# Patient Record
Sex: Female | Born: 1995 | Race: Black or African American | Hispanic: No | Marital: Single | Smoking: Former smoker
Health system: Southern US, Community
[De-identification: ages and names within clinical notes are randomized; demographics above are authoritative.]

## PROBLEM LIST (undated history)

## (undated) DIAGNOSIS — J302 Other seasonal allergic rhinitis: Secondary | ICD-10-CM

---
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
## 2015-02-25 ENCOUNTER — Ambulatory Visit: Payer: Medicaid Other | Admitting: Family Medicine

## 2015-03-09 ENCOUNTER — Ambulatory Visit (INDEPENDENT_AMBULATORY_CARE_PROVIDER_SITE_OTHER): Payer: Medicaid Other | Admitting: Family Medicine

## 2015-03-09 ENCOUNTER — Encounter: Payer: Self-pay | Admitting: Family Medicine

## 2015-03-09 VITALS — BP 100/61 | HR 80 | Temp 98.5°F | Wt 106.0 lb

## 2015-03-09 DIAGNOSIS — J069 Acute upper respiratory infection, unspecified: Secondary | ICD-10-CM | POA: Diagnosis present

## 2015-03-09 MED ORDER — PSEUDOEPHEDRINE HCL 60 MG PO TABS: 60.0000 mg | ORAL_TABLET | Freq: Three times a day (TID) | ORAL | Status: DC | PRN

## 2015-03-09 NOTE — Patient Instructions (Addendum)
Upper Respiratory Infection Treatment - you should: Take Advil/Tylenol for headaches and fever - I prescribed Sudafed which you should take only as needed for symptomatic relief before work.  You should be better in: 5-7 days  Call us if you have severe shortness of breath, high fever or are not better in 2 weeks

## 2015-03-09 NOTE — Progress Notes (Signed)
UPPER RESPIRATORY INFECTION  Onset: 2weeks ago Course: progressive; started out as cough, then developed HA and rhinorrhea Better with: NyQuil; flonase; hot tea Meds tried: OTC nyquil/flonase Sick contacts: no  Nasal discharge (color,laterality): thick and yellow  Sinusitis Risk Factors Fever: no   Headache/face pain: yes, left sided  Double sickening: no  Tooth pain: no   Allergy Risk Factors: Sneezing: no  Itchy scratchy throat: yes  Seasonal sx: yes; Zyrtec  Flu Risk Factors Headache: yes  Muscle aches: no  Severe fatigue: no    Red Flags  Stiff neck: no  Dyspnea: no  Rash: no  Swallowing difficulty: no    Objective: BP 100/61 mmHg  Pulse 80  Temp(Src) 98.5 F (36.9 C) (Oral)  Wt 106 lb (48.081 kg)  LMP 02/10/2015 Gen: NAD, alert HEENT: NCAT, EOMI, PERRL, OP clear, no exudate, mild nasal mucosa edema. CV: RRR, no murmur Resp: CTAB, no wheezes, non-labored   Assessment and plan:  Acute upper respiratory infection Patient has been experiencing upper respiratory symptoms for the past 2 weeks. Today in clinic symptoms appear fairly mild. Differential includes viral URI versus asthma reaction versus seasonal allergies. Current symptoms do not suggest any bacterial etiology. I discussed this thought process with the patient and she stated her understanding that no antibiotics were warranted at this time. Patient asked for help with symptomatic relief. Not currently interested in nasal spray. Patient would like something to take to alleviate symptoms while at work. - Sudafed 60 mg tablet 3 times a day when necessary. - Tylenol/ibuprofen for fever/discomfort - If symptoms persist over the course of 7-10 days see us back in the clinic for reevaluation.    Meds ordered this encounter  Medications  . pseudoephedrine (SUDAFED) 60 MG tablet    Sig: Take 1 tablet (60 mg total) by mouth every 8 (eight) hours as needed for congestion.    Dispense:  20 tablet    Refill:  0      Kathee DeltonIan D McKeag, MD,MS,  PGY2 03/10/2015 6:24 PM

## 2015-03-10 DIAGNOSIS — J069 Acute upper respiratory infection, unspecified: Secondary | ICD-10-CM | POA: Insufficient documentation

## 2015-03-10 NOTE — Assessment & Plan Note (Addendum)
Patient has been experiencing upper respiratory symptoms for the past 2 weeks. Today in clinic symptoms appear fairly mild. Differential includes viral URI versus asthma reaction versus seasonal allergies. Current symptoms do not suggest any bacterial etiology. I discussed this thought process with the patient and she stated her understanding that no antibiotics were warranted at this time. Patient asked for help with symptomatic relief. Not currently interested in nasal spray. Patient would like something to take to alleviate symptoms while at work. - Sudafed 60 mg tablet 3 times a day when necessary. - Tylenol/ibuprofen for fever/discomfort - If symptoms persist over the course of 7-10 days see us back in the clinic for reevaluation.

## 2015-04-11 ENCOUNTER — Other Ambulatory Visit: Payer: Self-pay | Admitting: Family Medicine

## 2015-04-28 ENCOUNTER — Ambulatory Visit: Payer: Medicaid Other

## 2015-05-09 ENCOUNTER — Emergency Department (HOSPITAL_COMMUNITY)
Admission: EM | Admit: 2015-05-09 | Discharge: 2015-05-09 | Discharge status: Absent without leave | Payer: Medicaid Other

## 2015-05-09 ENCOUNTER — Other Ambulatory Visit: Payer: Self-pay | Admitting: Family Medicine

## 2015-05-09 NOTE — ED Notes (Signed)
Called for triage, no answer

## 2015-05-10 ENCOUNTER — Ambulatory Visit (INDEPENDENT_AMBULATORY_CARE_PROVIDER_SITE_OTHER): Payer: Medicaid Other | Admitting: Family Medicine

## 2015-05-10 ENCOUNTER — Encounter: Payer: Self-pay | Admitting: Family Medicine

## 2015-05-10 VITALS — BP 98/61 | HR 73 | Temp 98.2°F | Ht 65.0 in | Wt 103.6 lb

## 2015-05-10 DIAGNOSIS — M79604 Pain in right leg: Secondary | ICD-10-CM | POA: Diagnosis present

## 2015-05-10 DIAGNOSIS — M79605 Pain in left leg: Secondary | ICD-10-CM | POA: Diagnosis not present

## 2015-05-10 DIAGNOSIS — Z23 Encounter for immunization: Secondary | ICD-10-CM | POA: Diagnosis present

## 2015-05-10 NOTE — Assessment & Plan Note (Signed)
Patient presents with complaints of bilateral leg pain which is now completely resolved. No history of injury or trauma.  No significant findings on physical exam. Etiology currently unknown for this bilateral leg pain. Patient states the pain was worse in bilateral knees. This causes me to believe that there could be some underlying patellar femoral pain syndrome at play. But further workup is difficult without patient experiencing symptoms at this time. -  Encouraged Aleve if symptoms recurred. -  Due to the possibility of patellofemoral pain syndrome I advised patient that initiation of regular quadriceps strengthening exercises may be beneficial in the future. Patient did not appear motivated at this time to begin these types of exercises but it may be something to stress later on if symptoms recur.

## 2015-05-10 NOTE — Patient Instructions (Addendum)
It was a pleasure seeing you today in our clinic. Today we discussed your leg pain. Here is the treatment plan we have discussed and agreed upon together:   -  At this time I do not live that there is any structural damage or injury occurring with your leg pain. -  If you experience this type of pain again in the future I would recommend taking Aleve twice a day as needed for this pain. If the pain is severe you may take 2 tablets each dosing.  Make sure to drink plenty of water. -  As we discussed in the clinic I believe it would be beneficial for you to begin regular quadriceps strengthening exercises.  This may help with the knee pain as well as any long-term effects of instability may be experiencing. -  If you have any persistent symptoms do not hesitate to make a follow-up appointment with your primary doctor.

## 2015-05-10 NOTE — Progress Notes (Signed)
Yesterday had body aches and lightheadedness. Legs aches were new, took ibuprofen, helped, came back later States that this AM all sxs have resolved.  Involved most of legs, and knees.    HPI  CC:  Bilateral leg pain.  Patient states that yesterday she experience some body aches. The achiness was most severe in her legs. She states that she has never experienced something like this in the past. She took ibuprofen which helped significantly, resolving the pain.  Unfortunately, after a period of time the pain came back. And again, responded well to ibuprofen. This morning she states that the pain has resolved completely. She states that she is currently asymptomatic. She denies any fevers, chills, weakness, numbness , paresthesias , urinary/fecal incontinence. No back pain. No trauma or injury.  Review of Systems   See HPI for ROS. All other systems reviewed and are negative.  Past medical history and social history reviewed and updated in the EMR as appropriate.  Objective: BP 98/61 mmHg  Pulse 73  Temp(Src) 98.2 F (36.8 C) (Oral)  Ht 5\' 5"  (1.651 m)  Wt 103 lb 9.6 oz (46.993 kg)  BMI 17.24 kg/m2  LMP 05/02/2015 Gen: NAD, alert, cooperative, and pleasant. Ext:  Strength 5/5 throughout, ROM full, Sensation intact throughout,  DTRs 2+ bilaterally.  All 4 ligaments of knees bilaterally intact with solid end points. Negative patellar grind. No evidence of effusion or swelling. No erythema or ecchymoses. Pulses intact bilaterally. Neuro: Alert and oriented, Speech clear, No gross deficits  Assessment and plan:  Bilateral leg pain  Patient presents with complaints of bilateral leg pain which is now completely resolved. No history of injury or trauma.  No significant findings on physical exam. Etiology currently unknown for this bilateral leg pain. Patient states the pain was worse in bilateral knees. This causes me to believe that there could be some underlying patellar femoral pain syndrome  at play. But further workup is difficult without patient experiencing symptoms at this time. -  Encouraged Aleve if symptoms recurred. -  Due to the possibility of patellofemoral pain syndrome I advised patient that initiation of regular quadriceps strengthening exercises may be beneficial in the future. Patient did not appear motivated at this time to begin these types of exercises but it may be something to stress later on if symptoms recur.    Orders Placed This Encounter  Procedures  . Flu Vaccine QUAD 36+ mos IM    Kathee DeltonIan D McKeag, MD,MS,  PGY2 05/10/2015 5:53 PM

## 2015-08-24 ENCOUNTER — Ambulatory Visit (INDEPENDENT_AMBULATORY_CARE_PROVIDER_SITE_OTHER): Payer: Medicaid Other | Admitting: Family Medicine

## 2015-08-24 VITALS — BP 95/57 | HR 79 | Temp 98.4°F | Wt 104.8 lb

## 2015-08-24 DIAGNOSIS — J069 Acute upper respiratory infection, unspecified: Secondary | ICD-10-CM | POA: Diagnosis not present

## 2015-08-24 DIAGNOSIS — Z3041 Encounter for surveillance of contraceptive pills: Secondary | ICD-10-CM | POA: Diagnosis not present

## 2015-08-24 DIAGNOSIS — Z3009 Encounter for other general counseling and advice on contraception: Secondary | ICD-10-CM | POA: Diagnosis not present

## 2015-08-24 LAB — POCT URINE PREGNANCY: Preg Test, Ur: NEGATIVE

## 2015-08-24 MED ORDER — NORGESTIM-ETH ESTRAD TRIPHASIC 0.18/0.215/0.25 MG-25 MCG PO TABS: 1.0000 | ORAL_TABLET | Freq: Every day | ORAL | Status: DC

## 2015-08-24 MED ORDER — BENZONATATE 100 MG PO CAPS: 100.0000 mg | ORAL_CAPSULE | Freq: Three times a day (TID) | ORAL | Status: DC | PRN

## 2015-08-24 NOTE — Assessment & Plan Note (Signed)
Patient presents with signs and symptoms consistent with upper respiratory infection. Etiology currently unknown however due to the timeline would highly suspect viral cause. Differential includes allergic rhinitis versus flu. Some flulike symptoms present however symptoms are relatively mild overall and patient has been afebrile throughout. - Encouraged continuation of antihistamine medications. - Tessalon Perles 3 times a day when necessary as needed for cough - Encouraged ibuprofen/acetaminophen use for fevers, chills, body aches. - Encouraged appropriate hydration

## 2015-08-24 NOTE — Assessment & Plan Note (Signed)
Patient is asking for refill on her oral concept is. She states she has been out for quite some time so she has not been taking these. However, she does state that she tolerated this medication well and would like to restart them as soon as possible. Denies recent sexual activity. LMP was about 48 hours ago. Urine pregnancy screen negative. - I restarted patient on previous oral contraceptive medication.

## 2015-08-24 NOTE — Patient Instructions (Signed)
Cough Treatment - you should: - Take the Tessalon Perles up to 3 times a day as needed for the cough. - Take over-the-counter ibuprofen or Tylenol for any body aches/chills/fever. - Make sure to stay well-hydrated. - Continue taking your home Zyrtec. You should be better in: 5-7 Call us if you have severe shortness of breath, high fever or are not better in 2 weeks

## 2015-08-24 NOTE — Progress Notes (Signed)
COUGH  Has been coughing for 1 days. Cough is: dry Sputum production: no Medications tried: zyrtec Taking blood pressure medications: no  Symptoms Runny nose: yes Mucous in back of throat: yes Throat burning or reflux: no Wheezing or asthma: no Fever: no Body Aches: YES Chest Pain: no Shortness of breath: no Leg swelling: no Hemoptysis: no Weight loss: no  Had flu shot.  Contraceptives: Patient would also like a refill on her oral contraceptive medication. She states she has not been on this medication for "a long time". She informed me that she has not had any recent sexual activity and her last menstrual period was about 2 days ago. Patient has willing and understanding of the need to obtain a urine pregnancy test today.  ROS see HPI Smoking Status noted  CC, SH/smoking status, and VS noted  Objective: BP 95/57 mmHg  Pulse 79  Temp(Src) 98.4 F (36.9 C) (Oral)  Wt 104 lb 12.8 oz (47.537 kg) Gen: NAD, alert, cooperative, and pleasant. HEENT: NCAT, EOMI, PERRL, clear rhinorrhea present, no LAD, OP erythematous without exudate CV: RRR, no murmur Resp: CTAB, no wheezes, non-labored Neuro: Alert and oriented, Speech clear, No gross deficits  Assessment and plan:  Acute upper respiratory infection Patient presents with signs and symptoms consistent with upper respiratory infection. Etiology currently unknown however due to the timeline would highly suspect viral cause. Differential includes allergic rhinitis versus flu. Some flulike symptoms present however symptoms are relatively mild overall and patient has been afebrile throughout. - Encouraged continuation of antihistamine medications. - Tessalon Perles 3 times a day when necessary as needed for cough - Encouraged ibuprofen/acetaminophen use for fevers, chills, body aches. - Encouraged appropriate hydration  Surveillance for birth control, oral contraceptives Patient is asking for refill on her oral concept is.  She states she has been out for quite some time so she has not been taking these. However, she does state that she tolerated this medication well and would like to restart them as soon as possible. Denies recent sexual activity. LMP was about 48 hours ago. Urine pregnancy screen negative. - I restarted patient on previous oral contraceptive medication.    Orders Placed This Encounter  Procedures  . POCT urine pregnancy    Meds ordered this encounter  Medications  . Norgestimate-Ethinyl Estradiol Triphasic (ORTHO TRI-CYCLEN LO) 0.18/0.215/0.25 MG-25 MCG tab    Sig: Take 1 tablet by mouth daily.    Dispense:  3 Package    Refill:  3  . benzonatate (TESSALON) 100 MG capsule    Sig: Take 1 capsule (100 mg total) by mouth 3 (three) times daily as needed for cough.    Dispense:  30 capsule    Refill:  0     Kathee DeltonIan D Kaleb Sek, MD,MS,  PGY2 08/24/2015 5:55 PM

## 2015-08-25 ENCOUNTER — Encounter: Payer: Medicaid Other | Admitting: Family Medicine

## 2015-10-19 ENCOUNTER — Ambulatory Visit (INDEPENDENT_AMBULATORY_CARE_PROVIDER_SITE_OTHER): Payer: Medicaid Other | Admitting: Family Medicine

## 2015-10-19 ENCOUNTER — Encounter: Payer: Self-pay | Admitting: Family Medicine

## 2015-10-19 VITALS — BP 92/54 | HR 68 | Temp 97.8°F | Ht 65.0 in | Wt 102.0 lb

## 2015-10-19 DIAGNOSIS — Z3041 Encounter for surveillance of contraceptive pills: Secondary | ICD-10-CM

## 2015-10-19 DIAGNOSIS — K589 Irritable bowel syndrome without diarrhea: Secondary | ICD-10-CM

## 2015-10-19 DIAGNOSIS — Z111 Encounter for screening for respiratory tuberculosis: Secondary | ICD-10-CM

## 2015-10-19 DIAGNOSIS — F4322 Adjustment disorder with anxiety: Secondary | ICD-10-CM | POA: Diagnosis not present

## 2015-10-19 DIAGNOSIS — J4520 Mild intermittent asthma, uncomplicated: Secondary | ICD-10-CM | POA: Diagnosis not present

## 2015-10-19 MED ORDER — NORETHINDRONE ACET-ETHINYL EST 1-20 MG-MCG PO TABS: 1.0000 | ORAL_TABLET | Freq: Every day | ORAL | Status: DC

## 2015-10-19 NOTE — Progress Notes (Signed)
   Subjective:    Patient ID: Heather Keller, female    DOB: [birth year], 20 y.o.   MRN: [id number]  HPISeveral issues. 1.Has been working at [employer].  Stressful.  She is excited about a day care job.  Needs a TB skin test.  Already enrolled to school for fall in child development. 2. Frequent headaches.  Back of neck.  No GI sx.  No photophobia or phonophobia. 3. Difficulty in maintaining wt.  She believes her current OCPs cause her some nausea.  Maintaining wt is a chronic issue. 4. Adjustment reaction with anxiety.  She has GAD tendancies.  Worse with external stress.  Some aspects of life OK.  New job is a plus.  Has a serious boyfriend who sounds like a positive influence.  Home still stressful.  She is anxious to get an education and start her own life.   5. Cough and using inhalor more.    Review of Systems     Objective:   Physical Exam Lungs Clear Cardiac RRR without m or g Abd benign Mood calm and collected throughout.        Assessment & Plan:

## 2015-10-19 NOTE — Assessment & Plan Note (Signed)
I was a bit worried about cough symptoms, but clear by exam.  Continue current Rx

## 2015-10-19 NOTE — Patient Instructions (Signed)
I sent in a prescription for a new birth control pill which I hope does not upset your stomach. The lack of appetite and headaches are signs of stress.  I am glad you are making good changes in your life. See me in two months if nausea and weight loss is still a problem. You get a tetanus shot today.

## 2015-10-19 NOTE — Assessment & Plan Note (Signed)
Nausea perhaps worsened by OCP.  Change to lower dose estrogen

## 2015-10-19 NOTE — Assessment & Plan Note (Signed)
Actually, she seems better today by history.  I am concerned with wt gain and headaches.  Discussed how these could be signs that something in her life needs to change.

## 2015-10-19 NOTE — Assessment & Plan Note (Signed)
Contributing to poor wt gain.

## 2015-10-21 ENCOUNTER — Telehealth: Payer: Self-pay | Admitting: *Deleted

## 2015-10-21 ENCOUNTER — Ambulatory Visit (INDEPENDENT_AMBULATORY_CARE_PROVIDER_SITE_OTHER): Payer: Medicaid Other | Admitting: *Deleted

## 2015-10-21 DIAGNOSIS — Z111 Encounter for screening for respiratory tuberculosis: Secondary | ICD-10-CM

## 2015-10-21 DIAGNOSIS — Z7689 Persons encountering health services in other specified circumstances: Secondary | ICD-10-CM

## 2015-10-21 LAB — TB SKIN TEST
INDURATION: 0 mm
TB Skin Test: NEGATIVE

## 2015-10-21 NOTE — Addendum Note (Signed)
Addended by: Lamonte SakaiZIMMERMAN RUMPLE, Melvenia Favela D on: 10/21/2015 09:36 AM   Modules accepted: Kipp BroodSmartSet

## 2015-10-21 NOTE — Telephone Encounter (Signed)
Pt informed, ready for pickup. Heather Keller, Heather RochesterJessica Dawn, CMA

## 2015-10-21 NOTE — Telephone Encounter (Signed)
Pt was here today for a PPD read.  She also dropped off form to be completed by Dr. Leveda AnnaHensel.  Placed in MDs box. Fleeger, Maryjo RochesterJessica Dawn, CMA

## 2015-10-21 NOTE — Progress Notes (Signed)
Pt is here today for PPD read.  Negative result. Fleeger, Maryjo RochesterJessica Dawn, CMA

## 2015-10-21 NOTE — Telephone Encounter (Signed)
done

## 2015-11-02 ENCOUNTER — Ambulatory Visit (HOSPITAL_COMMUNITY)
Admission: EM | Admit: 2015-11-02 | Discharge: 2015-11-02 | Disposition: A | Discharge status: Home / Self Care | Payer: Medicaid Other | Attending: Emergency Medicine | Admitting: Emergency Medicine

## 2015-11-02 ENCOUNTER — Encounter (HOSPITAL_COMMUNITY): Payer: Self-pay | Admitting: *Deleted

## 2015-11-02 DIAGNOSIS — R112 Nausea with vomiting, unspecified: Secondary | ICD-10-CM

## 2015-11-02 MED ORDER — ONDANSETRON HCL 4 MG PO TABS: 4.0000 mg | ORAL_TABLET | Freq: Four times a day (QID) | ORAL | Status: DC

## 2015-11-02 NOTE — ED Notes (Signed)
Pt  Has  Several  Symptoms  Which includes  Vomiting   Cough  At  Night  As  Well  As  Headache     recently  Changed  Her  bcp      She  Reports  A  History  Of  Asthma  And the  Cough is  Worse  At  Nights

## 2015-11-02 NOTE — ED Provider Notes (Signed)
CSN: [id number]     Arrival date & time 11/02/15  1428 History   First MD Initiated Contact with Patient 11/02/15 1452     Chief Complaint  Patient presents with  . Emesis   (Consider location/radiation/quality/duration/timing/severity/associated sxs/prior Treatment) HPI History obtained from patient: Location: abdomen  Context/Duration: Symptoms started last night after starting a new birth control pill.. States she vomited a couple of time  But mostly has not brought anything else since that time Severity: No pain just feels nauseated.   Quality: Timing:    constant        Home Treatment: tried to eat some soup earlier today Associated symptoms:   Family History:  none   Past Medical History  Diagnosis Date  . Seasonal allergies   . Asthma    History reviewed. No pertinent past surgical history. History reviewed. No pertinent family history. Social History  Substance Use Topics  . Smoking status: Passive Smoke Exposure - Never Smoker  . Smokeless tobacco: Never Used  . Alcohol Use: No   OB History    No data available     Review of Systems  Denies: HEADACHE, NAUSEA, ABDOMINAL PAIN, CHEST PAIN, CONGESTION, DYSURIA, SHORTNESS OF BREATH  Allergies  Flagyl  Home Medications   Prior to Admission medications   Medication Sig Start Date End Date Taking? Authorizing Provider  ADVAIR HFA 115-21 MCG/ACT inhaler INHALE 2 PUFFS BY MOUTH TWICE DAILY 04/12/15   Moses Manners, MD  cetirizine (ZYRTEC) 10 MG tablet TAKE 1 TABLET BY MOUTH DAILY 05/10/15   Moses Manners, MD  fluticasone (FLONASE) 50 MCG/ACT nasal spray INSTILL 1 SPRAY INTO EACH NOSTRIL ONCE DAILY 04/12/15   Moses Manners, MD  ibuprofen (ADVIL,MOTRIN) 600 MG tablet TAKE 1 TABLET BY MOUTH EVERY 6 HOURS AS NEEDED FOR PAIN 04/12/15   Moses Manners, MD  norethindrone-ethinyl estradiol (MICROGESTIN,JUNEL,LOESTRIN) 1-20 MG-MCG tablet Take 1 tablet by mouth daily. 10/19/15   Moses Manners, MD  ondansetron  (ZOFRAN) 4 MG tablet Take 1 tablet (4 mg total) by mouth every 6 (six) hours. 11/02/15   Tharon Aquas, PA  PROAIR HFA 108 (90 BASE) MCG/ACT inhaler INHALE 2 PUFFS BY MOUTH EVERY 6 HOURS AS NEEDED FOR SHORTNESS OF BREATH 04/12/15   Moses Manners, MD   Meds Ordered and Administered this Visit  Medications - No data to display  BP 130/70 mmHg  Pulse 78  Temp(Src) 98.6 F (37 C) (Oral)  Resp 18  SpO2 100%  LMP 10/25/2015 No data found.   Physical Exam NURSES NOTES AND VITAL SIGNS REVIEWED. CONSTITUTIONAL: Well developed, well nourished, no acute distress HEENT: normocephalic, atraumatic EYES: Conjunctiva normal NECK:normal ROM, supple, no adenopathy PULMONARY:No respiratory distress, normal effort ABDOMINAL: Soft, ND, NT BS+, No CVAT MUSCULOSKELETAL: Normal ROM of all extremities,  SKIN: warm and dry without rash PSYCHIATRIC: Mood and affect, behavior are normal  ED Course  Procedures (including critical care time)  Labs Review Labs Reviewed - No data to display  Imaging Review No results found.   Visual Acuity Review  Right Eye Distance:   Left Eye Distance:   Bilateral Distance:    Right Eye Near:   Left Eye Near:    Bilateral Near:      RX-Zofran  Return to work note provided.    MDM   1. Non-intractable vomiting with nausea, vomiting of unspecified type     Patient is reassured that there are no issues that require transfer to higher level of  care at this time or additional tests. Patient is advised to continue home symptomatic treatment. Patient is advised that if there are new or worsening symptoms to attend the emergency department, contact primary care provider, or return to UC. Instructions of care provided discharged home in stable condition.    THIS NOTE WAS GENERATED USING A VOICE RECOGNITION SOFTWARE PROGRAM. ALL REASONABLE EFFORTS  WERE MADE TO PROOFREAD THIS DOCUMENT FOR ACCURACY.  I have verbally reviewed the discharge instructions  with the patient. A printed AVS was given to the patient.  All questions were answered prior to discharge.      Tharon AquasFrank C Cleopha Indelicato, PA 11/02/15 [phone]

## 2015-11-02 NOTE — Discharge Instructions (Signed)
Nausea and Vomiting  Nausea means you feel sick to your stomach. Throwing up (vomiting) is a reflex where stomach contents come out of your mouth.  HOME CARE   · Take medicine as told by your doctor.  · Do not force yourself to eat. However, you do need to drink fluids.  · If you feel like eating, eat a normal diet as told by your doctor.    Eat rice, wheat, potatoes, bread, lean meats, yogurt, fruits, and vegetables.    Avoid high-fat foods.  · Drink enough fluids to keep your pee (urine) clear or pale yellow.  · Ask your doctor how to replace body fluid losses (rehydrate). Signs of body fluid loss (dehydration) include:    Feeling very thirsty.    Dry lips and mouth.    Feeling dizzy.    Dark pee.    Peeing less than normal.    Feeling confused.    Fast breathing or heart rate.  GET HELP RIGHT AWAY IF:   · You have blood in your throw up.  · You have black or bloody poop (stool).  · You have a bad headache or stiff neck.  · You feel confused.  · You have bad belly (abdominal) pain.  · You have chest pain or trouble breathing.  · You do not pee at least once every 8 hours.  · You have cold, clammy skin.  · You keep throwing up after 24 to 48 hours.  · You have a fever.  MAKE SURE YOU:   · Understand these instructions.  · Will watch your condition.  · Will get help right away if you are not doing well or get worse.     This information is not intended to replace advice given to you by your health care provider. Make sure you discuss any questions you have with your health care provider.     Document Released: 10/17/2007 Document Revised: 07/23/2011 Document Reviewed: 09/29/2010  Elsevier Interactive Patient Education ©2016 [employer]vier Inc[employer]

## 2015-11-03 ENCOUNTER — Ambulatory Visit (INDEPENDENT_AMBULATORY_CARE_PROVIDER_SITE_OTHER): Payer: Self-pay | Admitting: Family Medicine

## 2015-11-03 ENCOUNTER — Encounter: Payer: Self-pay | Admitting: Family Medicine

## 2015-11-03 VITALS — BP 97/54 | HR 67 | Wt 100.8 lb

## 2015-11-03 DIAGNOSIS — R519 Headache, unspecified: Secondary | ICD-10-CM

## 2015-11-03 DIAGNOSIS — R634 Abnormal weight loss: Secondary | ICD-10-CM

## 2015-11-03 DIAGNOSIS — R51 Headache: Secondary | ICD-10-CM

## 2015-11-03 LAB — CBC
HCT: 38 % (ref 35.0–45.0)
HEMOGLOBIN: 12.7 g/dL (ref 11.7–15.5)
MCH: 31.2 pg (ref 27.0–33.0)
MCHC: 33.4 g/dL (ref 32.0–36.0)
MCV: 93.4 fL (ref 80.0–100.0)
MPV: 10.2 fL (ref 7.5–12.5)
PLATELETS: 231 10*3/uL (ref 140–400)
RBC: 4.07 MIL/uL (ref 3.80–5.10)
RDW: 12.6 % (ref 11.0–15.0)
WBC: 5.3 10*3/uL (ref 3.8–10.8)

## 2015-11-03 LAB — TSH: TSH: 0.4 m[IU]/L — AB (ref 0.50–4.30)

## 2015-11-03 MED ORDER — PROMETHAZINE HCL 12.5 MG PO TABS: 12.5000 mg | ORAL_TABLET | Freq: Three times a day (TID) | ORAL | Status: DC | PRN

## 2015-11-03 NOTE — Progress Notes (Signed)
   Subjective:    Patient ID: Heather Keller, female    DOB: 1996-01-27, 20 y.o.   MRN: [id number]  HPI  Patient presents for Same Day Appointment  CC: headache  # Headache:  Seems to be every day for the past month or so  Located all around her head, doesn't use any specific words to describe but does feel throbbing/squeezing  Has associated nausea, no vomiting  Lights sometimes bother her  Lasts most of the day. It will go away after a nap but can come right back  No numbness/tingling  Went to urgent care and thinks the zofran that was given gave her a headache  She is wondering if her recent change in birth control has contributed  # Weight loss  She is concerned about weight loss over the past 2-3 months. Says she used to be around 118lbs  Also has heat intolerance  No heart palpitations  Grandmother has a history of thyroid issues  Social Hx: passive smoke exposure  Review of Systems   See HPI for ROS.   Past medical history, surgical, family, and social history reviewed and updated in the EMR as appropriate.  Objective:  BP 97/54 mmHg  Pulse 67  Wt 100 lb 12.8 oz (45.723 kg)  LMP 10/25/2015 Vitals and nursing note reviewed  General: no apparent distress  HEENT: PERRL, EOMI Neck: thyroid feels slightly enlarged, symmetric elevation, no tenderness or nodules CV: normal rate, regular rhythm, no murmurs, rubs or gallop  Resp: clear to auscultation bilaterally, normal effort Neuro: alert and oriented, no deficits noted, CN grossly normal, gait normal.  Assessment & Plan:  1. Nonintractable headache, unspecified chronicity pattern, unspecified headache type Has some features consistent with migraine. Patient declines migraine cocktail today. Discussed hydration, adequate sleep, OTC analgesics as initial treatment. If headache pattern continues can consider imitrex/migraine abortive therapy. Given phenergan to take instead of zofran.  2. Loss of  weight Patient concerned about ~15lb weight loss in past 3 months -- I reviewed doc flowsheets and it looks like the last time she weight over 115lbs was in 2015. She has multiple weights in past 2 months around 103-106lbs. She has some complaints that would be consistent with hyperthyroidism, so discussed screening TSH which she would like to do. Will follow up with results. - TSH - CBC   Return if symptoms worsen or fail to improve.

## 2015-11-07 ENCOUNTER — Telehealth: Payer: Self-pay | Admitting: Family Medicine

## 2015-11-07 NOTE — Telephone Encounter (Signed)
Routing to PCP as well as doctor who ordered labs. Lamonte SakaiZimmerman Rumple, Dejha King D, [place]

## 2015-11-07 NOTE — Telephone Encounter (Signed)
Pt is calling to get her lab results. jw °

## 2015-11-07 NOTE — Telephone Encounter (Signed)
TSH suggests hyperthyroid, which fits with her wt loss and anxiety.  Spoke with grandmother and I will see this week.  Likely order thyroid uptake scan.

## 2015-11-09 ENCOUNTER — Ambulatory Visit (INDEPENDENT_AMBULATORY_CARE_PROVIDER_SITE_OTHER): Payer: Medicaid Other | Admitting: Family Medicine

## 2015-11-09 ENCOUNTER — Encounter: Payer: Self-pay | Admitting: Family Medicine

## 2015-11-09 VITALS — BP 98/63 | HR 71 | Temp 98.2°F | Ht 65.0 in | Wt 100.4 lb

## 2015-11-09 DIAGNOSIS — R946 Abnormal results of thyroid function studies: Secondary | ICD-10-CM | POA: Diagnosis not present

## 2015-11-09 DIAGNOSIS — R7989 Other specified abnormal findings of blood chemistry: Secondary | ICD-10-CM | POA: Insufficient documentation

## 2015-11-09 NOTE — Patient Instructions (Signed)
Google hyperthyroidism which means an over active thyroid.  It fits with your weight loss.  An overactive thyroid would also make you more prone to anxiety and sleeplessness. I will call with results.  If the blood test confirm that you are hyperthyroid, the next step will be a thyroid scan.

## 2015-11-09 NOTE — Assessment & Plan Note (Addendum)
Confirm that she has true hyperthyroidism.  If yes, will need uptake scan.  Labs now normal.  Doubt thyroid abnormality.  She could have had some transient thryoiditis.    Given normal studies, no further WU needed.

## 2015-11-10 LAB — TSH: TSH: 0.89 m[IU]/L (ref 0.50–4.30)

## 2015-11-10 LAB — T4, FREE: Free T4: 1.3 ng/dL (ref 0.8–1.4)

## 2015-11-10 LAB — T3, FREE: T3 FREE: 3.6 pg/mL (ref 3.0–4.7)

## 2015-11-10 NOTE — Progress Notes (Signed)
   Subjective:    Patient ID: Heather Keller, female    DOB: [birth year], 20 y.o.   MRN: [id number]  HPI FU anxiety, wt loss and low TSH.   While on first glance the symptoms fit perfectly with hyperthyroidism, her anxiety and slim build are longstanding.  I feel I need to verify.  Denies heat or cold intolerance.  Sleeps well.      Review of Systems     Objective:   Physical Exam Not tachy. No hand or tongue tremor. Cardiac RRR without m or g No thyromegally on exam.  Non tender.   No hyperreflexia        Assessment & Plan:

## 2015-12-03 ENCOUNTER — Other Ambulatory Visit: Payer: Self-pay | Admitting: Family Medicine

## 2016-01-19 ENCOUNTER — Encounter: Payer: Self-pay | Admitting: Family Medicine

## 2016-01-19 ENCOUNTER — Ambulatory Visit (INDEPENDENT_AMBULATORY_CARE_PROVIDER_SITE_OTHER): Payer: Medicaid Other | Admitting: Family Medicine

## 2016-01-19 DIAGNOSIS — F4323 Adjustment disorder with mixed anxiety and depressed mood: Secondary | ICD-10-CM

## 2016-01-19 DIAGNOSIS — Z23 Encounter for immunization: Secondary | ICD-10-CM

## 2016-01-19 MED ORDER — MIRTAZAPINE 15 MG PO TABS: 15.0000 mg | ORAL_TABLET | Freq: Every day | ORAL | 6 refills | Status: DC

## 2016-01-19 NOTE — Patient Instructions (Signed)
I sent in a prescription for a new medicine for depression.   See me in three weeks to see how things are going.  We will talk about adding counseling then.
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
## 2016-01-20 NOTE — Assessment & Plan Note (Signed)
I will still consider this an adjustment reaction.  Will treat with mirtazapine to also help with sleep and appetite. Watch for emergence of bipolar symptoms.   Also could be PTSD with anniversary reaction.

## 2016-02-09 ENCOUNTER — Ambulatory Visit (INDEPENDENT_AMBULATORY_CARE_PROVIDER_SITE_OTHER): Payer: Medicaid Other | Admitting: Family Medicine

## 2016-02-09 ENCOUNTER — Encounter: Payer: Self-pay | Admitting: Family Medicine

## 2016-02-09 DIAGNOSIS — F4323 Adjustment disorder with mixed anxiety and depressed mood: Secondary | ICD-10-CM

## 2016-02-09 NOTE — Progress Notes (Signed)
   Subjective:    Patient ID: Heather Keller, female    DOB: 1996/02/16, 20 y.o.   MRN: [id number]  HPI Recheck depression.  Doing great on mirtazapine.  She is sleeping much better.  Mood is good.  Able to cope better during interactions with her difficult mother.  Appetite improved. No SI or HI   Review of Systems     Objective:   Physical Exam 7 lb wt gain noted (she is please.) Affect good.        Assessment & Plan:

## 2016-02-09 NOTE — Assessment & Plan Note (Signed)
Excellent response to mirtazipine.  Plan to continue for 3-6 months.

## 2016-02-09 NOTE — Patient Instructions (Signed)
I am delighted that the medication seems to work well for you.   I suggest you stay on the medicine for 3-6 months.   See me when you are ready to stop the medication so we can talk. Pick a good time to stop the medication.

## 2016-03-09 ENCOUNTER — Other Ambulatory Visit: Payer: Self-pay | Admitting: Family Medicine

## 2016-05-11 ENCOUNTER — Encounter: Payer: Self-pay | Admitting: Internal Medicine

## 2016-05-11 ENCOUNTER — Ambulatory Visit (INDEPENDENT_AMBULATORY_CARE_PROVIDER_SITE_OTHER): Payer: Medicaid Other | Admitting: Internal Medicine

## 2016-05-11 ENCOUNTER — Other Ambulatory Visit (HOSPITAL_COMMUNITY)
Admission: RE | Admit: 2016-05-11 | Discharge: 2016-05-11 | Disposition: A | Discharge status: Home / Self Care | Payer: Medicaid Other | Source: Ambulatory Visit | Attending: Family Medicine | Admitting: Family Medicine

## 2016-05-11 VITALS — BP 100/58 | HR 99 | Temp 98.2°F | Ht 63.0 in | Wt 107.4 lb

## 2016-05-11 DIAGNOSIS — A599 Trichomoniasis, unspecified: Secondary | ICD-10-CM | POA: Diagnosis not present

## 2016-05-11 DIAGNOSIS — N760 Acute vaginitis: Secondary | ICD-10-CM | POA: Diagnosis not present

## 2016-05-11 DIAGNOSIS — R109 Unspecified abdominal pain: Secondary | ICD-10-CM | POA: Diagnosis not present

## 2016-05-11 DIAGNOSIS — Z113 Encounter for screening for infections with a predominantly sexual mode of transmission: Secondary | ICD-10-CM | POA: Insufficient documentation

## 2016-05-11 DIAGNOSIS — N898 Other specified noninflammatory disorders of vagina: Secondary | ICD-10-CM | POA: Diagnosis not present

## 2016-05-11 DIAGNOSIS — B9689 Other specified bacterial agents as the cause of diseases classified elsewhere: Secondary | ICD-10-CM | POA: Diagnosis not present

## 2016-05-11 LAB — POCT UA - MICROSCOPIC ONLY: Trichomonas, UA: POSITIVE

## 2016-05-11 LAB — POCT URINALYSIS DIPSTICK
GLUCOSE UA: NEGATIVE
Ketones, UA: NEGATIVE
NITRITE UA: NEGATIVE
Spec Grav, UA: >=1.03
UROBILINOGEN UA: 1
pH, UA: 6

## 2016-05-11 LAB — POCT WET PREP (WET MOUNT): Clue Cells Wet Prep Whiff POC: POSITIVE

## 2016-05-11 LAB — POCT URINE PREGNANCY: Preg Test, Ur: NEGATIVE

## 2016-05-11 MED ORDER — TINIDAZOLE 500 MG PO TABS: 2.0000 g | ORAL_TABLET | Freq: Every day | ORAL | 0 refills | Status: DC

## 2016-05-11 MED ORDER — METRONIDAZOLE 0.75 % VA GEL: 1.0000 | Freq: Every day | VAGINAL | 0 refills | Status: DC

## 2016-05-11 NOTE — Progress Notes (Signed)
   Subjective:    Heather Keller - 20 y.o. female MRN [id number]  Date of birth: 1996-03-02  HPI  Heather PiggsShynelle M Allard is here for SDA for abdominal pain.  Abdominal Pain: Started approximately 1 week ago. Located in suprapubic region bilaterally. Pain is not constant and occurs on and off. Is not severe. Does not appear to be related to anything such as eating or bowel movement. Patient is sexually active with one partner. She uses condoms for contraception. Endorses new vaginal discharge present this week. Denies pelvic pain and pruritis. Denies dysuria, urinary frequency, and urinary urgency. No pain with walking. Menstrual periods are regular. LMP 12/18. Denies constipation, diarrhea, nausea and vomiting. Eating normally.   -  reports that she is a non-smoker but has been exposed to tobacco smoke. She has never used smokeless tobacco. - Review of Systems: Per HPI. - Past Medical History: Patient Active Problem List   Diagnosis Date Noted  . Trichomoniasis 05/13/2016  . Low TSH level 11/09/2015  . Surveillance for birth control, oral contraceptives 08/24/2015  . Keratosis pilaris 03/01/2014  . Ear pit 06/04/2013  . Irritable bowel syndrome 09/24/2012  . Bacterial vaginosis 02/06/2012  . Abdominal pain 01/26/2011  . 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 RHINITIS, ALLERGIC 07/11/2006  . Asthma, mild intermittent 07/11/2006   - Medications: reviewed and updated   Objective:   Physical Exam BP (!) 100/58   Pulse 99   Temp 98.2 F (36.8 C) (Oral)   Ht 5\' 3"  (1.6 m)   Wt 107 lb 6.4 oz (48.7 kg)   LMP 04/30/2016   SpO2 99%   BMI 19.03 kg/m  Gen: NAD, alert, cooperative with exam, well-appearing, non-toxic Abd: SNTND, BS present, no guarding or organomegaly, negative McBurney's, negative Murphy's sign  GU/GYN: Exam performed in the presence of a chaperone. External genitalia within normal limits.  Vaginal mucosa pink, moist, normal rugae.  Nonfriable  cervix without lesions or bleeding noted on speculum exam. Moderate amount of stringy white discharge present in vaginal vault.  Bimanual exam revealed normal, nongravid uterus.  No cervical motion tenderness. No adnexal masses bilaterally.     Assessment & Plan:   Abdominal pain Abdominal pain in suprapubic region. UA negative for infectious process. Upreg negative. Wet prep significant for BV and trichomoniasis. Treating with Metro gel and Tinidazole given previous intolerance to oral Flagyl. Will obtain CG/Chlamydia. Doubt PID given lack of fevers and no CMT on exam. Patient does have h/o IBS but symptoms do not seem consistent with flare up. Abdominal exam benign so doubt process such as appendicitis.Considered process such as ovarian torsion or ovarian cyst but given that pain is not severe and is not unilateral, less likely. If pain continued despite treatment for BV and trich, would consider obtaining pelvic ultrasound.   Trichomoniasis Discussed option of treating with Flagyl paired with anti-emetic given previous history of intolerance. Patient elected to try Tinidazole as well. Discussed that this is a STI and instructed patient to inform partners and to abstain from sexual intercourse until partners tested/treated. Recommended HIV and RPR testing but patient declined.   Bacterial vaginosis Treat with Metronidazole gel   Marcy Sirenatherine Ledell Codrington, D.O. 05/13/2016, 11:34 AM PGY-2, [facility]

## 2016-05-11 NOTE — Patient Instructions (Signed)
Take the Tinidazole all at once to treat the Trichomonas.   Use the Metro gel 5 days nightly to treat the bacterial vaginosis.   Please inform your partner and have him tested and treated.   If abdominal pain continues despite treatment please let us know.

## 2016-05-13 DIAGNOSIS — A599 Trichomoniasis, unspecified: Secondary | ICD-10-CM | POA: Insufficient documentation

## 2016-05-13 NOTE — Assessment & Plan Note (Addendum)
Abdominal pain in suprapubic region. UA negative for infectious process. Upreg negative. Wet prep significant for BV and trichomoniasis. Treating with Metro gel and Tinidazole given previous intolerance to oral Flagyl. Will obtain CG/Chlamydia. Doubt PID given lack of fevers and no CMT on exam. Patient does have h/o IBS but symptoms do not seem consistent with flare up. Abdominal exam benign so doubt process such as appendicitis.Considered process such as ovarian torsion or ovarian cyst but given that pain is not severe and is not unilateral, less likely. If pain continued despite treatment for BV and trich, would consider obtaining pelvic ultrasound.

## 2016-05-13 NOTE — Assessment & Plan Note (Signed)
Treat with Metronidazole gel

## 2016-05-13 NOTE — Assessment & Plan Note (Signed)
Discussed option of treating with Flagyl paired with anti-emetic given previous history of intolerance. Patient elected to try Tinidazole as well. Discussed that this is a STI and instructed patient to inform partners and to abstain from sexual intercourse until partners tested/treated. Recommended HIV and RPR testing but patient declined.

## 2016-05-15 LAB — CERVICOVAGINAL ANCILLARY ONLY
CHLAMYDIA, DNA PROBE: NEGATIVE
NEISSERIA GONORRHEA: NEGATIVE

## 2016-05-16 ENCOUNTER — Ambulatory Visit (INDEPENDENT_AMBULATORY_CARE_PROVIDER_SITE_OTHER): Payer: Medicaid Other | Admitting: Internal Medicine

## 2016-05-16 ENCOUNTER — Telehealth: Payer: Self-pay

## 2016-05-16 ENCOUNTER — Encounter: Payer: Self-pay | Admitting: Internal Medicine

## 2016-05-16 DIAGNOSIS — J111 Influenza due to unidentified influenza virus with other respiratory manifestations: Secondary | ICD-10-CM | POA: Diagnosis present

## 2016-05-16 MED ORDER — OSELTAMIVIR PHOSPHATE 75 MG PO CAPS: 75.0000 mg | ORAL_CAPSULE | Freq: Two times a day (BID) | ORAL | 0 refills | Status: DC

## 2016-05-16 NOTE — Telephone Encounter (Signed)
-----   Message from Arvilla Marketatherine Lauren Wallace, DO sent at 05/16/2016  8:31 AM EST ----- Please let patient know that Gonorrhea and Chlamydia were negative.   Marcy Sirenatherine Wallace, D.O. 05/16/2016, 8:31 AM PGY-2, [facility]

## 2016-05-16 NOTE — Telephone Encounter (Signed)
LVM for pt to call the office.If pt calls, please inform her the test for Gonorrhea and Chlamydia were negative. Sunday SpillersSharon T Isabellamarie Keller, CMA

## 2016-05-16 NOTE — Assessment & Plan Note (Signed)
Symptoms consistent with influenza. Patient appears well hydrated and has been able to maintain PO intake of fluids. Noted that BP is a little low but upon chart review patient's BP has historically been on the lower side. Given that symptoms have been present for <48 hours, will treat with Tamiflu. Encouraged continued supportive care at home. Discussed return precautions.

## 2016-05-16 NOTE — Progress Notes (Signed)
c 

## 2016-05-16 NOTE — Patient Instructions (Signed)
Take the Tamiflu twice per day for five days to hopefully help with flu like symptoms.   Continue with supportive care like ibuprofen, dayquil/nyquil, mucinex, etc.   Please let us know if you are still very sick or having worsening symptoms next week as you might have developed a secondary bacterial infection.

## 2016-05-16 NOTE — Progress Notes (Signed)
   Subjective:    Mart PiggsShynelle M Munro - 21 y.o. female MRN [id number]  Date of birth: [birth year]  HPI  Mart PiggsShynelle M Dimmick is here for SDA for not feeling well.  Patient reports symptoms of cough, diffuse muscle aches, subjective fevers, and chills since Monday night. Cough is mostly dry. She has also had nasal drainage/congestion. She endorses fatigue. Has had decreased appetite but is still drinking lots of fluids. Denies nausea, vomiting and diarrhea. Has been taking Ibuprofen, Nyquil, and Benadryl. Has also been using a OTC muscle rub to help with aches. Denies known sick contacts.  -  reports that she is a non-smoker but has been exposed to tobacco smoke. She has never used smokeless tobacco. - Review of Systems: Per HPI. - Past Medical History: Patient Active Problem List   Diagnosis Date Noted  . Trichomoniasis 05/13/2016  . Low TSH level 11/09/2015  . Surveillance for birth control, oral contraceptives 08/24/2015  . Keratosis pilaris 03/01/2014  . Influenza 06/04/2013  . Ear pit 06/04/2013  . Irritable bowel syndrome 09/24/2012  . Bacterial vaginosis 02/06/2012  . Abdominal pain 01/26/2011  . 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 RHINITIS, ALLERGIC 07/11/2006  . Asthma, mild intermittent 07/11/2006   - Medications: reviewed and updated   Objective:   Physical Exam BP (!) 90/50   Pulse 87   Temp 99 F (37.2 C) (Oral)   Ht 5\' 3"  (1.6 m)   Wt 109 lb 3.2 oz (49.5 kg)   LMP 04/30/2016   SpO2 97%   BMI 19.34 kg/m  Gen: NAD, appears ill and tired  HEENT: oropharynx erythematous but no tonsillar exudate, no TTP over the frontal or maxillary sinuses, TMs normal bilaterally, EOMI, PERRL, MMM  CV: RRR, good S1/S2, no murmur Resp: CTABL, no wheezes, non-labored    Assessment & Plan:   Influenza Symptoms consistent with influenza. Patient appears well hydrated and has been able to maintain PO intake of fluids. Noted that BP is a little low but upon  chart review patient's BP has historically been on the lower side. Given that symptoms have been present for <48 hours, will treat with Tamiflu. Encouraged continued supportive care at home. Discussed return precautions.     Marcy Sirenatherine Gwendlyn Hanback, D.O. 05/16/2016, 11:07 AM PGY-2, [facility]

## 2016-05-17 NOTE — Telephone Encounter (Signed)
Spoke with patient and relayed results. Patient did not have any other questions and she expressed understanding. Heather Keller, CMA D

## 2016-08-08 ENCOUNTER — Inpatient Hospital Stay (HOSPITAL_COMMUNITY)
Admission: AD | Admit: 2016-08-08 | Discharge: 2016-08-08 | Discharge status: Against Medical Advice | Payer: Medicaid Other | Source: Ambulatory Visit | Attending: Obstetrics & Gynecology | Admitting: Obstetrics & Gynecology

## 2016-08-08 ENCOUNTER — Other Ambulatory Visit: Payer: Self-pay | Admitting: Family Medicine

## 2016-08-08 DIAGNOSIS — Z202 Contact with and (suspected) exposure to infections with a predominantly sexual mode of transmission: Secondary | ICD-10-CM | POA: Insufficient documentation

## 2016-08-08 LAB — URINALYSIS, ROUTINE W REFLEX MICROSCOPIC
Bilirubin Urine: NEGATIVE
Glucose, UA: NEGATIVE mg/dL
HGB URINE DIPSTICK: NEGATIVE
Ketones, ur: NEGATIVE mg/dL
LEUKOCYTES UA: NEGATIVE
Nitrite: NEGATIVE
Protein, ur: NEGATIVE mg/dL
Specific Gravity, Urine: 1.019 (ref 1.005–1.030)
pH: 5 (ref 5.0–8.0)

## 2016-08-08 LAB — WET PREP, GENITAL
Sperm: NONE SEEN
Trich, Wet Prep: NONE SEEN
Yeast Wet Prep HPF POC: NONE SEEN

## 2016-08-08 NOTE — MAU Note (Signed)
Pt not in lobby x2 

## 2016-08-08 NOTE — MAU Note (Signed)
Pt not in lobby x1 

## 2016-08-08 NOTE — MAU Note (Signed)
Pt not in lobby x3 

## 2016-08-08 NOTE — MAU Note (Signed)
Pt here for STD testing. Unsure if significant other has been faithful. States that she has a thin, white discharge with odor. Some lower abdominal cramping-not having any pain at this time. LMP: 06/24/2016

## 2016-08-09 LAB — POCT PREGNANCY, URINE: Preg Test, Ur: NEGATIVE

## 2016-08-09 LAB — GC/CHLAMYDIA PROBE AMP (~~LOC~~) NOT AT ARMC
CHLAMYDIA, DNA PROBE: NEGATIVE
NEISSERIA GONORRHEA: NEGATIVE

## 2016-08-10 ENCOUNTER — Telehealth (HOSPITAL_COMMUNITY): Payer: Self-pay

## 2016-08-10 NOTE — Telephone Encounter (Signed)
Patient called stating that she received voicemail asking her to return the call. Patient stated that the voicemail asked for a [place]. Upon doing further investigation, the patient's cell number is listed as contact for [name] as well.

## 2017-01-31 ENCOUNTER — Ambulatory Visit (INDEPENDENT_AMBULATORY_CARE_PROVIDER_SITE_OTHER): Payer: Self-pay

## 2017-01-31 DIAGNOSIS — Z23 Encounter for immunization: Secondary | ICD-10-CM

## 2017-10-08 ENCOUNTER — Encounter (HOSPITAL_COMMUNITY): Payer: Self-pay | Admitting: Emergency Medicine

## 2017-10-08 ENCOUNTER — Ambulatory Visit (HOSPITAL_COMMUNITY)
Admission: EM | Admit: 2017-10-08 | Discharge: 2017-10-08 | Disposition: A | Discharge status: Home / Self Care | Payer: Self-pay | Attending: Internal Medicine | Admitting: Internal Medicine

## 2017-10-08 DIAGNOSIS — Z79899 Other long term (current) drug therapy: Secondary | ICD-10-CM | POA: Insufficient documentation

## 2017-10-08 DIAGNOSIS — R1110 Vomiting, unspecified: Secondary | ICD-10-CM

## 2017-10-08 DIAGNOSIS — J45909 Unspecified asthma, uncomplicated: Secondary | ICD-10-CM | POA: Insufficient documentation

## 2017-10-08 DIAGNOSIS — K529 Noninfective gastroenteritis and colitis, unspecified: Secondary | ICD-10-CM | POA: Insufficient documentation

## 2017-10-08 DIAGNOSIS — R1030 Lower abdominal pain, unspecified: Secondary | ICD-10-CM

## 2017-10-08 DIAGNOSIS — Z7722 Contact with and (suspected) exposure to environmental tobacco smoke (acute) (chronic): Secondary | ICD-10-CM | POA: Insufficient documentation

## 2017-10-08 DIAGNOSIS — Z881 Allergy status to other antibiotic agents status: Secondary | ICD-10-CM | POA: Insufficient documentation

## 2017-10-08 LAB — POCT I-STAT, CHEM 8
BUN: 10 mg/dL (ref 6–20)
Calcium, Ion: 1.19 mmol/L (ref 1.15–1.40)
Chloride: 101 mmol/L (ref 101–111)
Creatinine, Ser: 0.5 mg/dL (ref 0.44–1.00)
GLUCOSE: 52 mg/dL — AB (ref 65–99)
HEMATOCRIT: 39 % (ref 36.0–46.0)
HEMOGLOBIN: 13.3 g/dL (ref 12.0–15.0)
POTASSIUM: 3.4 mmol/L — AB (ref 3.5–5.1)
Sodium: 139 mmol/L (ref 135–145)
TCO2: 27 mmol/L (ref 22–32)

## 2017-10-08 LAB — HCG, SERUM, QUALITATIVE: Preg, Serum: NEGATIVE

## 2017-10-08 NOTE — ED Provider Notes (Signed)
[facility]    CSN: [id number] Arrival date & time: 10/08/17  1323     History   Chief Complaint Chief Complaint  Patient presents with  . Emesis    HPI Heather Keller is a 22 y.o. female.   She presents today with onset of vomiting yesterday, had some blood in 1 of the episodes, worried her.  Able to keep down fluids, has not been vomiting today.  She has a little bit of crampy lower abdominal discomfort today, had a normal bowel movement this morning and has not yet had diarrhea.  No fever, no malaise.    HPI  Past Medical History:  Diagnosis Date  . Asthma   . Seasonal allergies     Patient Active Problem List   Diagnosis Date Noted  . Trichomoniasis 05/13/2016  . Low TSH level 11/09/2015  . Surveillance for birth control, oral contraceptives 08/24/2015  . Keratosis pilaris 03/01/2014  . Influenza 06/04/2013  . Ear pit 06/04/2013  . Irritable bowel syndrome 09/24/2012  . Bacterial vaginosis 02/06/2012  . Abdominal pain 01/26/2011  . 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 RHINITIS, ALLERGIC 07/11/2006  . Asthma, mild intermittent 07/11/2006    History reviewed. No pertinent surgical history.  OB History   None      Home Medications    Prior to Admission medications   Medication Sig Start Date End Date Taking? Authorizing Provider  ADVAIR HFA 115-21 MCG/ACT inhaler INHALE 2 PUFFS BY MOUTH TWICE DAILY 04/12/15   Moses Manners, MD  cetirizine (ZYRTEC) 10 MG tablet TAKE 1 TABLET BY MOUTH DAILY 08/08/16   Moses Manners, MD  fluticasone (FLONASE) 50 MCG/ACT nasal spray SHAKE LIQUID AND USE 1 SPRAY IN EACH NOSTRIL EVERY DAY 08/08/16   Moses Manners, MD  ibuprofen (ADVIL,MOTRIN) 600 MG tablet TAKE 1 TABLET BY MOUTH EVERY 6 HOURS AS NEEDED FOR PAIN 12/05/15   Moses Manners, MD  metroNIDAZOLE (METROGEL VAGINAL) 0.75 % vaginal gel Place 1 Applicatorful vaginally at bedtime. 05/11/16   Arvilla Market, DO    mirtazapine (REMERON) 15 MG tablet Take 1 tablet (15 mg total) by mouth at bedtime. 01/19/16   Moses Manners, MD  norethindrone-ethinyl estradiol (MICROGESTIN,JUNEL,LOESTRIN) 1-20 MG-MCG tablet Take 1 tablet by mouth daily. 10/19/15   Moses Manners, MD  ondansetron (ZOFRAN) 4 MG tablet Take 1 tablet (4 mg total) by mouth every 6 (six) hours. 11/02/15   Tharon Aquas, PA  oseltamivir (TAMIFLU) 75 MG capsule Take 1 capsule (75 mg total) by mouth 2 (two) times daily. 05/16/16   Arvilla Market, DO  PROAIR HFA 108 [phone] Base) MCG/ACT inhaler INHALE 2 PUFFS BY MOUTH EVERY 6 HOURS AS NEEDED FOR SHORTNESS OF BREATH 03/09/16   Moses Manners, MD  promethazine (PHENERGAN) 12.5 MG tablet Take 1 tablet (12.5 mg total) by mouth every 8 (eight) hours as needed for nausea or vomiting. 11/03/15   Nani Ravens, MD  tinidazole [facility]) 500 MG tablet Take 4 tablets (2,000 mg total) by mouth daily with breakfast. 05/11/16   Arvilla Market, DO    Family History No family history on file.  Social History Social History   Tobacco Use  . Smoking status: Passive Smoke Exposure - Never Smoker  . Smokeless tobacco: Never Used  Substance Use Topics  . Alcohol use: No  . Drug use: No     Allergies   Flagyl [metronidazole]   Review of Systems Review  of Systems  All other systems reviewed and are negative.    Physical Exam Triage Vital Signs ED Triage Vitals  Enc Vitals Group     BP 10/08/17 1354 102/70     Pulse Rate 10/08/17 1354 98     Resp 10/08/17 1354 16     Temp 10/08/17 1354 98 F (36.7 C)     Temp Source 10/08/17 1354 Oral     SpO2 10/08/17 1354 99 %     Weight 10/08/17 1356 106 lb (48.1 kg)     Height --      Pain Score 10/08/17 1356 3     Pain Loc --    Orthostatic VS for the past 24 hrs:  BP- Lying Pulse- Lying BP- Sitting Pulse- Sitting BP- Standing at 0 minutes Pulse- Standing at 0 minutes  10/08/17 1547 98/60 75 105/64 76 105/65 84    Updated  Vital Signs BP 102/70   Pulse 98   Temp 98 F (36.7 C) (Oral)   Resp 16   Wt 106 lb (48.1 kg)   LMP 09/27/2017   SpO2 99%   BMI 18.78 kg/m  Physical Exam  Constitutional: She is oriented to person, place, and time. No distress.  HENT:  Head: Atraumatic.  Eyes:  Conjugate gaze observed, no eye redness/discharge  Neck: Neck supple.  Cardiovascular: Normal rate and regular rhythm.  Pulmonary/Chest: No respiratory distress. She has no wheezes. She has no rales.  Lungs clear, symmetric breath sounds   Abdominal: Soft. She exhibits no distension. There is no tenderness. There is no rebound and no guarding.  Musculoskeletal: Normal range of motion.  Neurological: She is alert and oriented to person, place, and time.  Skin: Skin is warm and dry.  Nursing note and vitals reviewed.    UC Treatments / Results  Labs Results for orders placed or performed during the hospital encounter of 10/08/17  hCG, serum, qualitative  Result Value Ref Range   Preg, Serum NEGATIVE NEGATIVE  I-STAT, chem 8  Result Value Ref Range   Sodium 139 135 - 145 mmol/L   Potassium 3.4 (L) 3.5 - 5.1 mmol/L   Chloride 101 101 - 111 mmol/L   BUN 10 6 - 20 mg/dL   Creatinine, Ser 0.34 0.44 - 1.00 mg/dL   Glucose, Bld 52 (L) 65 - 99 mg/dL   Calcium, Ion 7.42 5.95 - 1.40 mmol/L   TCO2 27 22 - 32 mmol/L   Hemoglobin 13.3 12.0 - 15.0 g/dL   HCT 63.8 75.6 - 43.3 %    EKG None  Radiology No results found.  Procedures Procedures (including critical care time)  Medications Ordered in UC Medications - No data to display  Final Clinical Impressions(s) / UC Diagnoses   Final diagnoses:  Acute gastroenteritis     Discharge Instructions     No danger signs on exam today.  Anticipate gradual improvement in crampiness over the next day or two; may have some loose stools yet.  Note for work today.  Push fluids; diet as tolerated.  Recheck if       Isa Rankin, MD 10/12/17 2220

## 2017-10-08 NOTE — Discharge Instructions (Addendum)
No danger signs on exam today.  Anticipate gradual improvement in crampiness over the next day or two; may have some loose stools yet.  Note for work today.  Push fluids; diet as tolerated.  Recheck if severe/sustained (>1 hr) abdominal pain, persistent (>a couple more days) vomiting, or if not continuing to slowly improve.  Blood count at the urgent care today was normal; potassium and glucose (salt and sugar levels) were slightly low.  Pregnancy test is pending; the urgent care will contact you if it is positive.  Note for work today.

## 2017-10-08 NOTE — ED Triage Notes (Signed)
PT reports she drank Sunday night, woke up Monday morning feeling nauseated. PT vomited twice. Second episode of vomiting contained "a lot of blood" per PT. PT denies eating or drinking anything red. PT has been nauseated since , but hasn't had any vomiting since. PT has had a normal brown BM since.

## 2018-01-06 ENCOUNTER — Encounter: Payer: Self-pay | Admitting: Internal Medicine

## 2018-01-29 ENCOUNTER — Other Ambulatory Visit (HOSPITAL_COMMUNITY)
Admission: RE | Admit: 2018-01-29 | Discharge: 2018-01-29 | Disposition: A | Discharge status: Home / Self Care | Payer: Medicaid Other | Source: Ambulatory Visit | Attending: Family Medicine | Admitting: Family Medicine

## 2018-01-29 ENCOUNTER — Other Ambulatory Visit: Payer: Self-pay

## 2018-01-29 ENCOUNTER — Ambulatory Visit (INDEPENDENT_AMBULATORY_CARE_PROVIDER_SITE_OTHER): Payer: Medicaid Other | Admitting: Family Medicine

## 2018-01-29 ENCOUNTER — Encounter: Payer: Self-pay | Admitting: Family Medicine

## 2018-01-29 VITALS — BP 92/54 | HR 68 | Temp 98.4°F | Ht 63.0 in | Wt 108.0 lb

## 2018-01-29 DIAGNOSIS — Z23 Encounter for immunization: Secondary | ICD-10-CM | POA: Diagnosis not present

## 2018-01-29 DIAGNOSIS — Z202 Contact with and (suspected) exposure to infections with a predominantly sexual mode of transmission: Secondary | ICD-10-CM

## 2018-01-29 DIAGNOSIS — B9689 Other specified bacterial agents as the cause of diseases classified elsewhere: Secondary | ICD-10-CM

## 2018-01-29 DIAGNOSIS — Z0000 Encounter for general adult medical examination without abnormal findings: Secondary | ICD-10-CM

## 2018-01-29 DIAGNOSIS — Z124 Encounter for screening for malignant neoplasm of cervix: Secondary | ICD-10-CM | POA: Diagnosis present

## 2018-01-29 DIAGNOSIS — N760 Acute vaginitis: Secondary | ICD-10-CM

## 2018-01-29 DIAGNOSIS — J4520 Mild intermittent asthma, uncomplicated: Secondary | ICD-10-CM

## 2018-01-29 LAB — POCT WET PREP (WET MOUNT)
CLUE CELLS WET PREP WHIFF POC: POSITIVE
Trichomonas Wet Prep HPF POC: ABSENT

## 2018-01-29 MED ORDER — ALBUTEROL SULFATE HFA 108 (90 BASE) MCG/ACT IN AERS: INHALATION_SPRAY | RESPIRATORY_TRACT | 12 refills | Status: DC

## 2018-01-29 MED ORDER — CETIRIZINE HCL 10 MG PO TABS: 10.0000 mg | ORAL_TABLET | Freq: Every day | ORAL | 3 refills | Status: AC

## 2018-01-29 MED ORDER — IBUPROFEN 600 MG PO TABS: 600.0000 mg | ORAL_TABLET | Freq: Four times a day (QID) | ORAL | 4 refills | Status: DC | PRN

## 2018-01-29 MED ORDER — FLUTICASONE-SALMETEROL 115-21 MCG/ACT IN AERO: 2.0000 | INHALATION_SPRAY | Freq: Two times a day (BID) | RESPIRATORY_TRACT | 12 refills | Status: DC

## 2018-01-29 NOTE — Assessment & Plan Note (Signed)
Out of all meds.  REfill

## 2018-01-29 NOTE — Assessment & Plan Note (Addendum)
Pap today.  No at risk behaviors.  STD testing based on her request and previous trich.

## 2018-01-29 NOTE — Patient Instructions (Signed)
I will call with test results I refilled your medications You still need a tetanus booster.   I am glad you are doing well.

## 2018-01-30 LAB — CERVICOVAGINAL ANCILLARY ONLY
CHLAMYDIA, DNA PROBE: NEGATIVE
NEISSERIA GONORRHEA: NEGATIVE

## 2018-01-30 LAB — HIV ANTIBODY (ROUTINE TESTING W REFLEX): HIV Screen 4th Generation wRfx: NONREACTIVE

## 2018-01-30 LAB — CYTOLOGY - PAP: Diagnosis: NEGATIVE

## 2018-01-30 MED ORDER — METRONIDAZOLE 0.75 % VA GEL: 1.0000 | Freq: Two times a day (BID) | VAGINAL | 0 refills | Status: DC

## 2018-01-30 NOTE — Assessment & Plan Note (Signed)
Asymptomatic but will treat based on wet prep

## 2018-01-30 NOTE — Progress Notes (Signed)
   Subjective:    Patient ID: Heather Keller, female    DOB: 1996-02-05, 22 y.o.   MRN: [id number]  HPI  Annual exam: First pap.  C/O mild vag DC.  In a monagamous relationship but has had trich before.  Wants STD testing.  Birth control, none.  Did not like OCPs  Now having unprotected sex.  States it is OK if she gets pregnant.  Does not want contraception.    She is in a good place.  Denies depression.  Feels good about her relationship.  Only complaint is that she is working two jobs.  "All I do is work and then come home to sleep."  Asthma is well controled:  Needs refill on inhalers.    Review of Systems No abd pain, DUB, urgency, frequency or dysurai.     Objective:   Physical Exam Abd benign Pelvic.  Normal external genital  Scant vag DC. Normal appearing nulliparous cervix  Normal bimanual without tenderness       Assessment & Plan:

## 2018-01-31 ENCOUNTER — Telehealth: Payer: Self-pay | Admitting: *Deleted

## 2018-01-31 NOTE — Telephone Encounter (Signed)
Received fax that advair was not covered by insurance.  Per our records she has family planning.  No medications (other than birth Control) are covered by this insurance. Fleeger, Maryjo RochesterJessica Dawn, CMA

## 2018-02-03 NOTE — Telephone Encounter (Signed)
Attempted to call x 2 today.  Message left on voice mail.  I need to speak to her to discuss options.  Will try again tomorrow.

## 2018-02-04 NOTE — Telephone Encounter (Signed)
Patient returned call. Left call back of [phone].  Ples SpecterAlisa Brake, RN [facility] RN)

## 2018-02-04 NOTE — Telephone Encounter (Signed)
Pt called nurse line returning Dr. Cyndia DiverHensels phone call. I informed her he has been trying to reach her several times today and to keep her phone by her between now and 5. Will forward back to Hensel.

## 2018-02-04 NOTE — Telephone Encounter (Signed)
Called x 3 today.  Still unable to connect.

## 2018-02-04 NOTE — Telephone Encounter (Signed)
Called and connected.  Informed her that she now has Medicaid family planning only.  She has only mild asthma.  No hospitalizations or ER visits over the past 2 years.  Only uses albuterol a couple of times per month.  We decided to DC advair and only use prn albuterol.  She will watch for worsening asthma.

## 2018-02-04 NOTE — Addendum Note (Signed)
Addended by: Moses MannersHENSEL, Ashraf Mesta A on: 02/04/2018 03:48 PM   Modules accepted: Orders

## 2018-10-22 ENCOUNTER — Other Ambulatory Visit: Payer: Self-pay

## 2018-10-22 ENCOUNTER — Other Ambulatory Visit (HOSPITAL_COMMUNITY)
Admission: RE | Admit: 2018-10-22 | Discharge: 2018-10-22 | Disposition: A | Discharge status: Home / Self Care | Payer: Medicaid Other | Source: Ambulatory Visit | Attending: Family Medicine | Admitting: Family Medicine

## 2018-10-22 ENCOUNTER — Ambulatory Visit (INDEPENDENT_AMBULATORY_CARE_PROVIDER_SITE_OTHER): Payer: Self-pay | Admitting: Family Medicine

## 2018-10-22 ENCOUNTER — Encounter: Payer: Self-pay | Admitting: Family Medicine

## 2018-10-22 VITALS — BP 102/58 | HR 65 | Ht 63.0 in | Wt 107.5 lb

## 2018-10-22 DIAGNOSIS — N898 Other specified noninflammatory disorders of vagina: Secondary | ICD-10-CM | POA: Diagnosis present

## 2018-10-22 DIAGNOSIS — B9689 Other specified bacterial agents as the cause of diseases classified elsewhere: Secondary | ICD-10-CM

## 2018-10-22 DIAGNOSIS — N760 Acute vaginitis: Secondary | ICD-10-CM

## 2018-10-22 LAB — POCT WET PREP (WET MOUNT): Clue Cells Wet Prep Whiff POC: POSITIVE

## 2018-10-22 MED ORDER — METRONIDAZOLE 0.75 % VA GEL: 1.0000 | Freq: Two times a day (BID) | VAGINAL | 0 refills | Status: AC

## 2018-10-22 MED ORDER — PRENATAL VITAMIN AND MINERAL 28-0.8 MG PO TABS: 1.0000 | ORAL_TABLET | Freq: Every day | ORAL | 3 refills | Status: DC

## 2018-10-22 MED ORDER — TINIDAZOLE 500 MG PO TABS: 2.0000 g | ORAL_TABLET | Freq: Once | ORAL | 0 refills | Status: AC

## 2018-10-22 NOTE — Progress Notes (Signed)
  Subjective:   Patient ID: Heather Keller    DOB: 1995-07-05, 23 y.o. female   MRN: [id number]  Heather Keller is a 23 y.o. female with a history of allergic rhinitis, IBS, asthma, adjustment reaction here for   VAGINAL DISCHARGE  Having vaginal discharge for a few days. Discharge consistency: thin Discharge color: cream Medications tried: none Has had BV and yeast infections in the past and thinks it may be one of those but wants to be checked for STIs.  Recent antibiotic use: no Sex in last month: yes, wears condoms Possible STD exposure: possible  Symptoms Fever: no Dysuria: yes Vaginal bleeding: no Abdomen or Pelvic pain: no Back pain: no Genital sores or ulcers: no Rash: no Pain during sex: no Missed menstrual period: no, LMP 10/13/18  Review of Systems:  Per HPI.  [place], medications and smoking status reviewed.  Objective:   BP (!) 102/58   Pulse 65   Ht 5\' 3"  (1.6 m)   Wt 107 lb 8 oz (48.8 kg)   LMP 10/13/2018   SpO2 99%   BMI 19.04 kg/m  Vitals and nursing note reviewed.  General: well nourished, well developed, in no acute distress with non-toxic appearance CV: regular rate and rhythm without murmurs, rubs, or gallops Lungs: clear to auscultation bilaterally with normal work of breathing Abdomen: soft, non-tender, non-distended, normoactive bowel sounds GYN:  External genitalia within normal limits.  Vaginal mucosa pink, moist, normal rugae.  Nonfriable cervix without lesions, minimal white discharge noted on speculum exam, no bleeding.  Bimanual exam revealed normal, nongravid uterus.  No cervical motion tenderness. No adnexal masses bilaterally.   Skin: warm, dry, no rashes or lesions Extremities: warm and well perfused, normal tone MSK: ROM grossly intact, strength intact, gait normal Neuro: Alert and oriented, speech normal  Assessment & Plan:   Vaginal discharge Normal GYN exam. Wet prep indicative of BV and trichomonas. Also obtained GC/CT,  will call with results. Instructed patient to inform partner of STI so they can be tested and treated. Safe sex practices discussed. Not currently on contraception and not interested in starting, has previously been on depo and OCPs. Contraception handout provided and Rx for prenatal vitamins sent to pharmacy.  Orders Placed This Encounter  Procedures  . POCT Wet Prep [facility])   Meds ordered this encounter  Medications  . DISCONTD: Prenatal Vit-Fe Fumarate-FA (PRENATAL VITAMIN AND MINERAL) 28-0.8 MG TABS    Sig: Take 1 tablet by mouth daily.    Dispense:  90 tablet    Refill:  3  . Prenatal Vit-Fe Fumarate-FA (PRENATAL VITAMIN AND MINERAL) 28-0.8 MG TABS    Sig: Take 1 tablet by mouth daily.    Dispense:  90 tablet    Refill:  3  . tinidazole (TINDAMAX) 500 MG tablet    Sig: Take 4 tablets (2,000 mg total) by mouth once for 1 dose.    Dispense:  4 tablet    Refill:  0  . metroNIDAZOLE (METROGEL VAGINAL) 0.75 % vaginal gel    Sig: Place 1 Applicatorful vaginally 2 (two) times daily for 5 days.    Dispense:  100 g    Refill:  0    Rory Percy, DO PGY-2, [facility] 10/22/2018 1:05 PM

## 2018-10-22 NOTE — Assessment & Plan Note (Addendum)
Normal GYN exam. Wet prep indicative of BV and trichomonas. Also obtained GC/CT, will call with results. Instructed patient to inform partner of STI so they can be tested and treated. Safe sex practices discussed. Not currently on contraception and not interested in starting, has previously been on depo and OCPs. Contraception handout provided and Rx for prenatal vitamins sent to pharmacy.

## 2018-10-22 NOTE — Patient Instructions (Signed)
It was great to see you!  Our plans for today:  - We are checking some labs today, we will call you or send you a letter with these results. - Make sure to wear a condom EVERY TIME you have sex for the full time to prevent pregnancy and STIs.  - Review the birth control handout provided and let [place] know if you decide on a method.  - I have sent a prescription for prenatal vitamins to your pharmacy.  Take care and seek immediate care sooner if you develop any concerns.   Dr. [facility]

## 2018-10-23 LAB — CERVICOVAGINAL ANCILLARY ONLY
Chlamydia: POSITIVE — AB
Neisseria Gonorrhea: NEGATIVE

## 2018-10-24 ENCOUNTER — Other Ambulatory Visit: Payer: Self-pay

## 2018-10-24 ENCOUNTER — Ambulatory Visit (INDEPENDENT_AMBULATORY_CARE_PROVIDER_SITE_OTHER): Payer: Medicaid Other

## 2018-10-24 DIAGNOSIS — A749 Chlamydial infection, unspecified: Secondary | ICD-10-CM

## 2018-10-24 MED ORDER — AZITHROMYCIN 500 MG PO TABS
1000.0000 mg | ORAL_TABLET | Freq: Once | ORAL | Status: AC
  Administered 2018-10-24: 1000 mg via ORAL

## 2018-10-24 NOTE — Progress Notes (Signed)
Patient presents in nurse clinic for Chlamydia treatment. Patient given 1gram Azithromycin PO, per [place] orders. Patient tolerated well. Patient informed to refrain from sexual intercourse for 1 week. Use condoms always.

## 2018-12-30 ENCOUNTER — Ambulatory Visit (INDEPENDENT_AMBULATORY_CARE_PROVIDER_SITE_OTHER): Payer: Self-pay | Admitting: Family Medicine

## 2018-12-30 ENCOUNTER — Other Ambulatory Visit: Payer: Self-pay

## 2018-12-30 VITALS — BP 100/75 | HR 73

## 2018-12-30 DIAGNOSIS — L308 Other specified dermatitis: Secondary | ICD-10-CM

## 2018-12-30 MED ORDER — HYDROCORTISONE 2.5 % EX OINT: TOPICAL_OINTMENT | Freq: Two times a day (BID) | CUTANEOUS | 0 refills | Status: AC

## 2018-12-30 NOTE — Progress Notes (Signed)
     Subjective: Chief Complaint  Patient presents with  . Rash    HPI: Heather Keller is a 22 y.o. presenting to clinic today to discuss the following:  Rash Patient with history of eczema states she developed a rash on her arms that then spread to the side of her body and to her back. It was small red bumps that did not hurt and at first did not itch but later after several days began to itch. She got OTC hydrocortisone cream which did provide relief. The rash is already getting better but she is wondering if she should get something stronger to help get rid of the rash.   No new travel, detergents, soaps, foods, medications, or OTC supplements.   She denies fever, chills, nausea, vomiting, or diarrhea.     ROS noted in HPI.   Past Medical, Surgical, Social, and Family History Reviewed & Updated per EMR.   Pertinent Historical Findings include:   Social History   Tobacco Use  Smoking Status Passive Smoke Exposure - Never Smoker  Smokeless Tobacco Never Used    Objective: BP 100/75   Pulse 73   LMP 12/08/2018   SpO2 98%  Vitals and nursing notes reviewed  Physical Exam Gen: Alert and Oriented x 3, NAD HEENT: Normocephalic, atraumatic Ext: no clubbing, cyanosis, or edema Skin: warm, dry, intact, small maculopapular erythematous rash on the forearms and on the right ribcage.  Assessment/Plan:  Eczema Unclear what caused her most recent exacerbation of eczema but given the appearance of the rash and her symptoms it is the most likely cause of her rash. - Hydrocortisone 2.5% ointment - cont with good skin care and hydration - Cont with Zyrtec daily  PATIENT EDUCATION PROVIDED: See AVS    Diagnosis and plan along with any newly prescribed medication(s) were discussed in detail with this patient today. The patient verbalized understanding and agreed with the plan. Patient advised if symptoms worsen return to clinic or ER.    Meds ordered this encounter   Medications  . hydrocortisone 2.5 % ointment    Sig: Apply topically 2 (two) times daily.    Dispense:  30 g    Refill:  0     Harolyn Rutherford, DO 12/30/2018, 3:41 PM PGY-3 [place]

## 2018-12-30 NOTE — Patient Instructions (Signed)

## 2018-12-31 NOTE — Assessment & Plan Note (Signed)
Unclear what caused her most recent exacerbation of eczema but given the appearance of the rash and her symptoms it is the most likely cause of her rash. - Hydrocortisone 2.5% ointment - cont with good skin care and hydration - Cont with Zyrtec daily

## 2019-01-27 ENCOUNTER — Ambulatory Visit (HOSPITAL_COMMUNITY)
Admission: EM | Admit: 2019-01-27 | Discharge: 2019-01-27 | Disposition: A | Discharge status: Home / Self Care | Payer: Medicaid Other | Attending: Family Medicine | Admitting: Family Medicine

## 2019-01-27 ENCOUNTER — Encounter (HOSPITAL_COMMUNITY): Payer: Self-pay | Admitting: Urgent Care

## 2019-01-27 ENCOUNTER — Other Ambulatory Visit: Payer: Self-pay

## 2019-01-27 DIAGNOSIS — Z79899 Other long term (current) drug therapy: Secondary | ICD-10-CM | POA: Diagnosis not present

## 2019-01-27 DIAGNOSIS — B9789 Other viral agents as the cause of diseases classified elsewhere: Secondary | ICD-10-CM

## 2019-01-27 DIAGNOSIS — J4520 Mild intermittent asthma, uncomplicated: Secondary | ICD-10-CM

## 2019-01-27 DIAGNOSIS — J309 Allergic rhinitis, unspecified: Secondary | ICD-10-CM | POA: Insufficient documentation

## 2019-01-27 DIAGNOSIS — J4530 Mild persistent asthma, uncomplicated: Secondary | ICD-10-CM | POA: Diagnosis not present

## 2019-01-27 DIAGNOSIS — Z20828 Contact with and (suspected) exposure to other viral communicable diseases: Secondary | ICD-10-CM | POA: Diagnosis not present

## 2019-01-27 DIAGNOSIS — J069 Acute upper respiratory infection, unspecified: Secondary | ICD-10-CM | POA: Diagnosis not present

## 2019-01-27 MED ORDER — ALBUTEROL SULFATE HFA 108 (90 BASE) MCG/ACT IN AERS: INHALATION_SPRAY | RESPIRATORY_TRACT | 0 refills | Status: DC

## 2019-01-27 MED ORDER — BENZONATATE 100 MG PO CAPS: 100.0000 mg | ORAL_CAPSULE | Freq: Three times a day (TID) | ORAL | 0 refills | Status: DC | PRN

## 2019-01-27 MED ORDER — PROMETHAZINE-DM 6.25-15 MG/5ML PO SYRP: 5.0000 mL | ORAL_SOLUTION | Freq: Two times a day (BID) | ORAL | 0 refills | Status: DC | PRN

## 2019-01-27 NOTE — ED Triage Notes (Signed)
Pt sts cough with some congestion and SOB; pt sts hx of asthma; pt denies fever

## 2019-01-27 NOTE — ED Provider Notes (Signed)
MRN: [id number] DOB: Oct 10, 1995  Subjective:   Heather Keller is a 23 y.o. female with PMH of asthma, allergic rhinitis presenting for 5 to 7-day history of mild to moderate dry cough worse at night with associated shortness of breath and wheezing. Has tried APAP cold/sinus. Has not used her albuterol inhaler. No COVID 19 contacts. Works at [employer] and [employer].  She does smoke marijuana regularly but stopped when she felt ill.   No current facility-administered medications for this encounter.   Current Outpatient Medications:  .  albuterol (PROAIR HFA) 108 (90 Base) MCG/ACT inhaler, INHALE 2 PUFFS BY MOUTH EVERY 6 HOURS AS NEEDED FOR SHORTNESS OF BREATH, Disp: 8.5 g, Rfl: 12 .  cetirizine (ZYRTEC) 10 MG tablet, Take 1 tablet (10 mg total) by mouth daily., Disp: 90 tablet, Rfl: 3 .  hydrocortisone 2.5 % ointment, Apply topically 2 (two) times daily., Disp: 30 g, Rfl: 0 .  ibuprofen (ADVIL,MOTRIN) 600 MG tablet, Take 1 tablet (600 mg total) by mouth every 6 (six) hours as needed. for pain, Disp: 360 tablet, Rfl: 4 .  Prenatal Vit-Fe Fumarate-FA (PRENATAL VITAMIN AND MINERAL) 28-0.8 MG TABS, Take 1 tablet by mouth daily., Disp: 90 tablet, Rfl: 3   Allergies  Allergen Reactions  . Flagyl [Metronidazole] Nausea And Vomiting    Past Medical History:  Diagnosis Date  . Asthma   . Seasonal allergies      History reviewed. No pertinent surgical history.  Review of Systems  Constitutional: Negative for fever and malaise/fatigue.  HENT: Positive for congestion. Negative for ear pain, sinus pain and sore throat.   Eyes: Negative for discharge and redness.  Respiratory: Positive for cough, shortness of breath and wheezing. Negative for hemoptysis.   Cardiovascular: Negative for chest pain.  Gastrointestinal: Negative for abdominal pain, diarrhea, nausea and vomiting.  Genitourinary: Negative for dysuria, flank pain and hematuria.  Musculoskeletal: Negative for myalgias.  Skin:  Negative for rash.  Neurological: Positive for headaches. Negative for dizziness and weakness.  Psychiatric/Behavioral: Negative for depression and substance abuse.    Objective:   Vitals: BP 107/68 (BP Location: Left Arm)   Pulse 65   Temp 98 F (36.7 C) (Tympanic)   Resp 18   SpO2 98%   Physical Exam Constitutional:      General: She is not in acute distress.    Appearance: Normal appearance. She is well-developed. She is not ill-appearing, toxic-appearing or diaphoretic.  HENT:     Head: Normocephalic and atraumatic.     Right Ear: Tympanic membrane and ear canal normal. No drainage or tenderness. No middle ear effusion. Tympanic membrane is not erythematous.     Left Ear: Tympanic membrane and ear canal normal. No drainage or tenderness.  No middle ear effusion. Tympanic membrane is not erythematous.     Nose: Nose normal. No congestion or rhinorrhea.     Mouth/Throat:     Mouth: Mucous membranes are moist. No oral lesions.     Pharynx: Oropharynx is clear. No pharyngeal swelling, oropharyngeal exudate, posterior oropharyngeal erythema or uvula swelling.     Tonsils: No tonsillar exudate or tonsillar abscesses.  Eyes:     General: No scleral icterus.    Extraocular Movements: Extraocular movements intact.     Right eye: Normal extraocular motion.     Left eye: Normal extraocular motion.     Conjunctiva/sclera: Conjunctivae normal.     Pupils: Pupils are equal, round, and reactive to light.  Neck:     Musculoskeletal: Normal  range of motion and neck supple.  Cardiovascular:     Rate and Rhythm: Normal rate and regular rhythm.     Pulses: Normal pulses.     Heart sounds: Normal heart sounds. No murmur. No friction rub. No gallop.   Pulmonary:     Effort: Pulmonary effort is normal. No respiratory distress.     Breath sounds: No stridor. Wheezing (Mild in mid to lower lung fields bilaterally) present. No rhonchi or rales.  Lymphadenopathy:     Cervical: No cervical  adenopathy.  Skin:    General: Skin is warm and dry.     Findings: No rash.  Neurological:     General: No focal deficit present.     Mental Status: She is alert and oriented to person, place, and time.  Psychiatric:        Mood and Affect: Mood normal.        Behavior: Behavior normal.        Thought Content: Thought content normal.     Assessment and Plan :   1. Viral URI with cough   2. Mild intermittent asthma without complication   3. Mild persistent asthma without complication     Will manage for viral illness including the COVID-19, common cold, viral URI versus mild asthma, allergic rhinitis. Counseled patient on nature of COVID-19 including modes of transmission, diagnostic testing, management and supportive care.  Offered symptomatic relief. COVID 19 testing is pending.  Encouraged patient to continue avoiding marijuana use until she is better.  Counseled patient on potential for adverse effects with medications prescribed/recommended today, ER and return-to-clinic precautions discussed, patient verbalized understanding.     Jaynee Eagles, PA-C 01/27/19 1229

## 2019-01-27 NOTE — Discharge Instructions (Addendum)
We will manage this as a viral syndrome. For sore throat or cough try using a honey-based tea. Use 3 teaspoons of honey with juice squeezed from half lemon. Place shaved pieces of ginger into 1/2-1 cup of water and warm over stove top. Then mix the ingredients and repeat every 4 hours as needed. Please take Tylenol 500mg every 6 hours. Hydrate very well with at least 2 liters of water. Eat light meals such as soups to replenish electrolytes and soft fruits, veggies. Start an antihistamine like Zyrtec, Allegra or Claritin for postnasal drainage, sinus congestion.  You can take this together with pseudoephedrine (Sudafed) at a dose of 60 mg 3 times a day oral 120 mg twice daily as needed for the same kind of congestion.   °

## 2019-01-30 ENCOUNTER — Telehealth (HOSPITAL_COMMUNITY): Payer: Self-pay | Admitting: Emergency Medicine

## 2019-01-30 LAB — NOVEL CORONAVIRUS, NAA (HOSP ORDER, SEND-OUT TO REF LAB; TAT 18-24 HRS): SARS-CoV-2, NAA: NOT DETECTED

## 2019-01-30 NOTE — Telephone Encounter (Signed)
Covid Test Negative, Pt notified in [place].

## 2019-03-03 ENCOUNTER — Encounter (HOSPITAL_COMMUNITY): Payer: Self-pay

## 2019-03-03 ENCOUNTER — Ambulatory Visit (HOSPITAL_COMMUNITY)
Admission: EM | Admit: 2019-03-03 | Discharge: 2019-03-03 | Disposition: A | Discharge status: Home / Self Care | Payer: HRSA Program | Attending: Family Medicine | Admitting: Family Medicine

## 2019-03-03 ENCOUNTER — Other Ambulatory Visit: Payer: Self-pay

## 2019-03-03 DIAGNOSIS — R059 Cough, unspecified: Secondary | ICD-10-CM

## 2019-03-03 DIAGNOSIS — J029 Acute pharyngitis, unspecified: Secondary | ICD-10-CM | POA: Diagnosis not present

## 2019-03-03 DIAGNOSIS — Z20828 Contact with and (suspected) exposure to other viral communicable diseases: Secondary | ICD-10-CM | POA: Insufficient documentation

## 2019-03-03 DIAGNOSIS — Z7722 Contact with and (suspected) exposure to environmental tobacco smoke (acute) (chronic): Secondary | ICD-10-CM | POA: Insufficient documentation

## 2019-03-03 DIAGNOSIS — Z79899 Other long term (current) drug therapy: Secondary | ICD-10-CM | POA: Insufficient documentation

## 2019-03-03 DIAGNOSIS — R05 Cough: Secondary | ICD-10-CM | POA: Diagnosis not present

## 2019-03-03 DIAGNOSIS — J4520 Mild intermittent asthma, uncomplicated: Secondary | ICD-10-CM | POA: Insufficient documentation

## 2019-03-03 DIAGNOSIS — K589 Irritable bowel syndrome without diarrhea: Secondary | ICD-10-CM | POA: Insufficient documentation

## 2019-03-03 LAB — POCT RAPID STREP A: Streptococcus, Group A Screen (Direct): NEGATIVE

## 2019-03-03 NOTE — ED Provider Notes (Signed)
[place]    CSN: [id number] Arrival date & time: 03/03/19  1609      History   Chief Complaint Chief Complaint  Patient presents with  . Cough  . Sore Throat    HPI Heather Keller is a 23 y.o. female.   Patient presents with nonproductive cough and sore throat since yesterday.  She reports exposure to COVID at work.  She denies fever, chills, rash, shortness of breath, vomiting, diarrhea, or other symptoms.  The history is provided by the patient.    Past Medical History:  Diagnosis Date  . Asthma   . Seasonal allergies     Patient Active Problem List   Diagnosis Date Noted  . Vaginal discharge 10/22/2018  . Preventative health care 01/29/2018  . Low TSH level 11/09/2015  . Keratosis pilaris 03/01/2014  . Ear pit 06/04/2013  . Irritable bowel syndrome 09/24/2012  . Bacterial vaginosis 02/06/2012  . Abdominal pain 01/26/2011  . 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 RHINITIS, ALLERGIC 07/11/2006  . Asthma, mild intermittent 07/11/2006  . Eczema 07/11/2006    History reviewed. No pertinent surgical history.  OB History   No obstetric history on file.      Home Medications    Prior to Admission medications   Medication Sig Start Date End Date Taking? Authorizing Provider  albuterol (PROAIR HFA) 108 (90 Base) MCG/ACT inhaler INHALE 2 PUFFS BY MOUTH EVERY 6 HOURS AS NEEDED FOR SHORTNESS OF BREATH 01/27/19   Jaynee Eagles, PA-C  benzonatate (TESSALON) 100 MG capsule Take 1-2 capsules (100-200 mg total) by mouth 3 (three) times daily as needed. 01/27/19   Jaynee Eagles, PA-C  cetirizine (ZYRTEC) 10 MG tablet Take 1 tablet (10 mg total) by mouth daily. 01/29/18   Zenia Resides, MD  hydrocortisone 2.5 % ointment Apply topically 2 (two) times daily. 12/30/18   Nuala Alpha, DO  ibuprofen (ADVIL,MOTRIN) 600 MG tablet Take 1 tablet (600 mg total) by mouth every 6 (six) hours as needed. for pain 01/29/18   Zenia Resides, MD   Prenatal Vit-Fe Fumarate-FA (PRENATAL VITAMIN AND MINERAL) 28-0.8 MG TABS Take 1 tablet by mouth daily. 10/22/18   Rory Percy, DO  promethazine-dextromethorphan (PROMETHAZINE-DM) 6.25-15 MG/5ML syrup Take 5 mLs by mouth 2 (two) times daily as needed for cough. 01/27/19   Jaynee Eagles, PA-C    Family History Family History  Family history unknown: Yes    Social History Social History   Tobacco Use  . Smoking status: Passive Smoke Exposure - Never Smoker  . Smokeless tobacco: Never Used  Substance Use Topics  . Alcohol use: No  . Drug use: No     Allergies   Flagyl [metronidazole]   Review of Systems Review of Systems  Constitutional: Negative for chills and fever.  HENT: Positive for sore throat. Negative for congestion, ear pain, rhinorrhea and trouble swallowing.   Eyes: Negative for pain and visual disturbance.  Respiratory: Positive for cough. Negative for shortness of breath.   Cardiovascular: Negative for chest pain and palpitations.  Gastrointestinal: Negative for abdominal pain, diarrhea and vomiting.  Genitourinary: Negative for dysuria and hematuria.  Musculoskeletal: Negative for arthralgias and back pain.  Skin: Negative for color change and rash.  Neurological: Negative for seizures and syncope.  All other systems reviewed and are negative.    Physical Exam Triage Vital Signs ED Triage Vitals  Enc Vitals Group     BP      Pulse  Resp      Temp      Temp src      SpO2      Weight      Height      Head Circumference      Peak Flow      Pain Score      Pain Loc      Pain Edu?      Excl. in [place]?    No data found.  Updated Vital Signs BP 100/60 (BP Location: Left Arm)   Pulse 70   Temp 98 F (36.7 C) (Tympanic)   Resp 17   LMP 02/24/2019   SpO2 100%   Visual Acuity Right Eye Distance:   Left Eye Distance:   Bilateral Distance:    Right Eye Near:   Left Eye Near:    Bilateral Near:     Physical Exam Vitals signs and nursing  note reviewed.  Constitutional:      General: She is not in acute distress.    Appearance: She is well-developed.  HENT:     Head: Normocephalic and atraumatic.     Right Ear: Tympanic membrane normal.     Left Ear: Tympanic membrane normal.     Nose: Nose normal.     Mouth/Throat:     Mouth: Mucous membranes are moist.     Pharynx: Oropharynx is clear.  Eyes:     Conjunctiva/sclera: Conjunctivae normal.  Neck:     Musculoskeletal: Neck supple.  Cardiovascular:     Rate and Rhythm: Normal rate and regular rhythm.     Heart sounds: No murmur.  Pulmonary:     Effort: Pulmonary effort is normal. No respiratory distress.     Breath sounds: Normal breath sounds. No wheezing or rhonchi.  Abdominal:     General: Bowel sounds are normal.     Palpations: Abdomen is soft.     Tenderness: There is no abdominal tenderness. There is no guarding or rebound.  Skin:    General: Skin is warm and dry.     Findings: No rash.  Neurological:     General: No focal deficit present.     Mental Status: She is alert and oriented to person, place, and time.  Psychiatric:        Mood and Affect: Mood normal.        Behavior: Behavior normal.      UC Treatments / Results  Labs (all labs ordered are listed, but only abnormal results are displayed) Labs Reviewed  NOVEL CORONAVIRUS, NAA (HOSP ORDER, SEND-OUT TO REF LAB; TAT 18-24 HRS)  CULTURE, GROUP A STREP [facility])  POCT RAPID STREP A    EKG   Radiology No results found.  Procedures Procedures (including critical care time)  Medications Ordered in UC Medications - No data to display  Initial Impression / Assessment and Plan / UC Course  I have reviewed the triage vital signs and the nursing notes.  Pertinent labs & imaging results that were available during my care of the patient were reviewed by me and considered in my medical decision making (see chart for details).    Sore throat, cough.  Rapid strep negative.  Throat culture  pending.  COVID test performed here.  Instructed patient to self quarantine until the test result is back.  Instructed patient to go to the emergency department if develops high fever, shortness of breath, severe diarrhea, or other concerning symptoms.  Patient agrees with plan of care.  Final Clinical Impressions(s) / UC Diagnoses   Final diagnoses:  Sore throat  Cough     Discharge Instructions     Your rapid strep test is negative.  A throat culture is pending; we will call you if it is positive requiring treatment.    Your COVID test is pending.  You should self quarantine until your test result is back and is negative.    Go to the emergency department if you develop high fever, shortness of breath, severe diarrhea, or other concerning symptoms.       ED Prescriptions    None     PDMP not reviewed this encounter.   Mickie Bail, NP 03/03/19 [phone]

## 2019-03-03 NOTE — ED Triage Notes (Signed)
Pt presents with slight non productive cough and sore throat since yesterday; Pt has had a covid exposure at work.  Pt has Hx of asthma and seasonal allergies.

## 2019-03-03 NOTE — Discharge Instructions (Addendum)
Your rapid strep test is negative.  A throat culture is pending; we will call you if it is positive requiring treatment.    Your COVID test is pending.  You should self quarantine until your test result is back and is negative.    Go to the emergency department if you develop high fever, shortness of breath, severe diarrhea, or other concerning symptoms.    

## 2019-03-06 LAB — CULTURE, GROUP A STREP (THRC)

## 2019-03-06 LAB — NOVEL CORONAVIRUS, NAA (HOSP ORDER, SEND-OUT TO REF LAB; TAT 18-24 HRS): SARS-CoV-2, NAA: NOT DETECTED

## 2019-07-13 ENCOUNTER — Other Ambulatory Visit: Payer: Self-pay

## 2019-07-13 ENCOUNTER — Ambulatory Visit (HOSPITAL_COMMUNITY)
Admission: EM | Admit: 2019-07-13 | Discharge: 2019-07-13 | Disposition: A | Discharge status: Home / Self Care | Payer: BC Managed Care – PPO | Attending: Family Medicine | Admitting: Family Medicine

## 2019-07-13 DIAGNOSIS — S46911A Strain of unspecified muscle, fascia and tendon at shoulder and upper arm level, right arm, initial encounter: Secondary | ICD-10-CM

## 2019-07-13 MED ORDER — IBUPROFEN 800 MG PO TABS: 800.0000 mg | ORAL_TABLET | Freq: Three times a day (TID) | ORAL | 0 refills | Status: DC

## 2019-07-13 NOTE — ED Triage Notes (Signed)
Pt presents with right shoulder pain from unknown source X 2 days.

## 2019-07-13 NOTE — ED Provider Notes (Signed)
[facility]    CSN: [id number] Arrival date & time: 07/13/19  1557      History   Chief Complaint Chief Complaint  Patient presents with  . Shoulder Pain    HPI Heather Keller is a 24 y.o. female.   Heather Keller presents with complaints of right shoulder pain which started approximately 2 days ago. No specific known injury. Two days prior had worked at her job at [employer] at [employer] where she uses the [employer] which is a repetitive movement with her right arm. The day just prior to onset of symptoms she had gone to [employer] arcade/restaurant, but there was no specific injury at that time. 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 Movements worsen the pain. Ibuprofen hasn't helped, took it last last night. No numbness tingling or weakness to the arm. Denies any previous shoulder injury.     ROS per HPI, negative if not otherwise mentioned.      Past Medical History:  Diagnosis Date  . Asthma   . Seasonal allergies     Patient Active Problem List   Diagnosis Date Noted  . Vaginal discharge 10/22/2018  . Preventative health care 01/29/2018  . Low TSH level 11/09/2015  . Keratosis pilaris 03/01/2014  . Ear pit 06/04/2013  . Irritable bowel syndrome 09/24/2012  . Bacterial vaginosis 02/06/2012  . Abdominal pain 01/26/2011  . 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 RHINITIS, ALLERGIC 07/11/2006  . Asthma, mild intermittent 07/11/2006  . Eczema 07/11/2006    No past surgical history on file.  OB History   No obstetric history on file.      Home Medications    Prior to Admission medications   Medication Sig Start Date End Date Taking? Authorizing Provider  albuterol (PROAIR HFA) 108 (90 Base) MCG/ACT inhaler INHALE 2 PUFFS BY MOUTH EVERY 6 HOURS AS NEEDED FOR SHORTNESS OF BREATH 01/27/19   Wallis Bamberg, PA-C  benzonatate (TESSALON) 100 MG capsule Take 1-2 capsules  (100-200 mg total) by mouth 3 (three) times daily as needed. 01/27/19   Wallis Bamberg, PA-C  cetirizine (ZYRTEC) 10 MG tablet Take 1 tablet (10 mg total) by mouth daily. 01/29/18   Moses Manners, MD  hydrocortisone 2.5 % ointment Apply topically 2 (two) times daily. 12/30/18   Arlyce Harman, DO  ibuprofen (ADVIL) 800 MG tablet Take 1 tablet (800 mg total) by mouth 3 (three) times daily. 07/13/19   Georgetta Haber, NP  Prenatal Vit-Fe Fumarate-FA (PRENATAL VITAMIN AND MINERAL) 28-0.8 MG TABS Take 1 tablet by mouth daily. 10/22/18   Ellwood Dense, DO  promethazine-dextromethorphan (PROMETHAZINE-DM) 6.25-15 MG/5ML syrup Take 5 mLs by mouth 2 (two) times daily as needed for cough. 01/27/19   Wallis Bamberg, PA-C    Family History Family History  Family history unknown: Yes    Social History Social History   Tobacco Use  . Smoking status: Passive Smoke Exposure - Never Smoker  . Smokeless tobacco: Never Used  Substance Use Topics  . Alcohol use: No  . Drug use: No     Allergies   Flagyl [metronidazole] and Naproxen   Review of Systems Review of Systems   Physical Exam Triage Vital Signs ED Triage Vitals  Enc Vitals Group     BP 07/13/19 1651 (!) 102/54     Pulse Rate 07/13/19 1651 71     Resp 07/13/19 1651 18  Temp 07/13/19 1651 98.2 F (36.8 C)     Temp Source 07/13/19 1651 Oral     SpO2 07/13/19 1651 100 %     Weight --      Height --      Head Circumference --      Peak Flow --      Pain Score 07/13/19 1653 8     Pain Loc --      Pain Edu? --      Excl. in [place]? --    No data found.  Updated Vital Signs BP (!) 102/54 (BP Location: Right Arm)   Pulse 71   Temp 98.2 F (36.8 C) (Oral)   Resp 18   LMP 07/09/2019   SpO2 100%   Visual Acuity Right Eye Distance:   Left Eye Distance:   Bilateral Distance:    Right Eye Near:   Left Eye Near:    Bilateral Near:     Physical Exam Constitutional:      General: She is not in acute distress.    Appearance:  She is well-developed.  Cardiovascular:     Rate and Rhythm: Normal rate.  Pulmonary:     Effort: Pulmonary effort is normal.  Musculoskeletal:     Right shoulder: Tenderness present. No swelling, deformity, effusion, laceration, bony tenderness or crepitus. Decreased range of motion. Normal strength. Normal pulse.     Comments: Pain with external rotation to right anterior shoulder limiting rom; strength equal bilaterally; gross sensation intact to upper extremities; muscular and soft tissue tenderness to right anterior shoulder and superior trapezius, but without any bony tenderness ; elbow with full ROM and without pain  Skin:    General: Skin is warm and dry.  Neurological:     Mental Status: She is alert and oriented to person, place, and time.      UC Treatments / Results  Labs (all labs ordered are listed, but only abnormal results are displayed) Labs Reviewed - No data to display  EKG   Radiology No results found.  Procedures Procedures (including critical care time)  Medications Ordered in UC Medications - No data to display  Initial Impression / Assessment and Plan / UC Course  I have reviewed the triage vital signs and the nursing notes.  Pertinent labs & imaging results that were available during my care of the patient were reviewed by me and considered in my medical decision making (see chart for details).     Strain vs tendonitis to right shoulder. 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 Nsaids, ice, rest recommended. Follow up recommendations provided. Patient verbalized understanding and agreeable to plan.   Final Clinical Impressions(s) / UC Diagnoses   Final diagnoses:  Strain of right shoulder, initial encounter     Discharge Instructions     Ice application to right shoulder, especially after increased use.  See range of motion exercises to loosen shoulder.  Ibuprofen every 8 hours, take with food.  Limit repetitive use of the  shoulder for the next few days until pain improves.  If symptoms worsen or do not improve in the next 2 weeks to follow up with your PCP.     ED Prescriptions    Medication Sig Dispense Auth. Provider   ibuprofen (ADVIL) 800 MG tablet Take 1 tablet (800 mg total) by mouth 3 (three) times daily. 30 tablet Zigmund Gottron, NP     PDMP not reviewed this encounter.   Zigmund Gottron, NP 07/13/19  1845  

## 2019-07-13 NOTE — Discharge Instructions (Signed)
Ice application to right shoulder, especially after increased use.  See range of motion exercises to loosen shoulder.  Ibuprofen every 8 hours, take with food.  Limit repetitive use of the shoulder for the next few days until pain improves.  If symptoms worsen or do not improve in the next 2 weeks to follow up with your PCP.

## 2020-01-20 ENCOUNTER — Telehealth: Payer: Self-pay

## 2020-01-20 NOTE — Telephone Encounter (Signed)
Patient calls nurse line reporting body aches, fatigue, and nausea. Patient reports she works at [employer] and was sent home. Patient has not been vaccinated and is unsure of any covid positive contacts at this time. Patient denies fevers, cough, or SOB. We do not have any CIDD apts until Monday. Patient advised to quarantine until she can be covid tested. Patient advised to call pharmacies and set up a drive through apt. Patient verbalized understanding and will let us know results.

## 2020-03-30 ENCOUNTER — Other Ambulatory Visit: Payer: Self-pay

## 2020-03-30 ENCOUNTER — Encounter: Payer: Self-pay | Admitting: Family Medicine

## 2020-03-30 ENCOUNTER — Ambulatory Visit (INDEPENDENT_AMBULATORY_CARE_PROVIDER_SITE_OTHER): Payer: BC Managed Care – PPO | Admitting: Family Medicine

## 2020-03-30 VITALS — BP 100/58 | HR 112 | Ht 63.0 in | Wt 108.4 lb

## 2020-03-30 DIAGNOSIS — Z23 Encounter for immunization: Secondary | ICD-10-CM

## 2020-03-30 DIAGNOSIS — Z0000 Encounter for general adult medical examination without abnormal findings: Secondary | ICD-10-CM

## 2020-03-30 DIAGNOSIS — R946 Abnormal results of thyroid function studies: Secondary | ICD-10-CM

## 2020-03-30 DIAGNOSIS — N979 Female infertility, unspecified: Secondary | ICD-10-CM

## 2020-03-30 DIAGNOSIS — J4520 Mild intermittent asthma, uncomplicated: Secondary | ICD-10-CM | POA: Diagnosis not present

## 2020-03-30 DIAGNOSIS — J4530 Mild persistent asthma, uncomplicated: Secondary | ICD-10-CM | POA: Diagnosis not present

## 2020-03-30 DIAGNOSIS — F4323 Adjustment disorder with mixed anxiety and depressed mood: Secondary | ICD-10-CM

## 2020-03-30 MED ORDER — FLUTICASONE PROPIONATE HFA 110 MCG/ACT IN AERO: 2.0000 | INHALATION_SPRAY | Freq: Every day | RESPIRATORY_TRACT | 12 refills | Status: DC

## 2020-03-30 MED ORDER — ESCITALOPRAM OXALATE 10 MG PO TABS: 10.0000 mg | ORAL_TABLET | Freq: Every day | ORAL | 6 refills | Status: AC

## 2020-03-30 MED ORDER — ALBUTEROL SULFATE HFA 108 (90 BASE) MCG/ACT IN AERS: INHALATION_SPRAY | RESPIRATORY_TRACT | 1 refills | Status: DC

## 2020-03-30 NOTE — Patient Instructions (Signed)
Flu shot today I refilled your albuterol inhaler and prescribed a controler inhaler (flovent) I sent in an antidepressant.   I will call with the results of the blood work. Take some ibuprofen 2 pills 4 times per day for the shoulder. See me in one month.

## 2020-03-31 ENCOUNTER — Encounter: Payer: Self-pay | Admitting: Family Medicine

## 2020-03-31 DIAGNOSIS — R946 Abnormal results of thyroid function studies: Secondary | ICD-10-CM | POA: Insufficient documentation

## 2020-03-31 LAB — CMP14+EGFR
ALT: 15 IU/L (ref 0–32)
AST: 15 IU/L (ref 0–40)
Albumin/Globulin Ratio: 2 (ref 1.2–2.2)
Albumin: 4.3 g/dL (ref 3.9–5.0)
Alkaline Phosphatase: 51 IU/L (ref 44–121)
BUN/Creatinine Ratio: 21 (ref 9–23)
BUN: 13 mg/dL (ref 6–20)
Bilirubin Total: 0.7 mg/dL (ref 0.0–1.2)
CO2: 19 mmol/L — ABNORMAL LOW (ref 20–29)
Calcium: 9.2 mg/dL (ref 8.7–10.2)
Chloride: 105 mmol/L (ref 96–106)
Creatinine, Ser: 0.63 mg/dL (ref 0.57–1.00)
GFR calc Af Amer: 146 mL/min/{1.73_m2} (ref >59)
GFR calc non Af Amer: 127 mL/min/{1.73_m2} (ref >59)
Globulin, Total: 2.1 g/dL (ref 1.5–4.5)
Glucose: 69 mg/dL (ref 65–99)
Potassium: 4.3 mmol/L (ref 3.5–5.2)
Sodium: 136 mmol/L (ref 134–144)
Total Protein: 6.4 g/dL (ref 6.0–8.5)

## 2020-03-31 LAB — CBC
Hematocrit: 38.2 % (ref 34.0–46.6)
Hemoglobin: 13.5 g/dL (ref 11.1–15.9)
MCH: 32.7 pg (ref 26.6–33.0)
MCHC: 35.3 g/dL (ref 31.5–35.7)
MCV: 93 fL (ref 79–97)
Platelets: 244 10*3/uL (ref 150–450)
RBC: 4.13 x10E6/uL (ref 3.77–5.28)
RDW: 11.5 % — ABNORMAL LOW (ref 11.7–15.4)
WBC: 6.9 10*3/uL (ref 3.4–10.8)

## 2020-03-31 LAB — TSH: TSH: 0.447 u[IU]/mL — ABNORMAL LOW (ref 0.450–4.500)

## 2020-03-31 NOTE — Assessment & Plan Note (Signed)
Flu shot given.  I will readdress covid vaccination at FU visit.

## 2020-03-31 NOTE — Assessment & Plan Note (Signed)
Once again flairing.  Although I could not find in my quick search of records, she tells me she has responded well to medications in the past.  She wants to restart meds

## 2020-03-31 NOTE — Assessment & Plan Note (Signed)
Informed of labs which once again suggest low level hyperthyroidism.  I wonder if this may be contributing to infertility.

## 2020-03-31 NOTE — Progress Notes (Signed)
    SUBJECTIVE:   CHIEF COMPLAINT / HPI:   Several issues: #1 Asthma is flairing more frequently.  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  The cold air in the freezer and increasingly outside has left her SOB and needing her rescue inhaler.  Not currently on a controler med. #2 Depression/moody.  Present for months.  She is overwhelmed by some of the responsibilities of adulthood.  Now paying bills.  Unfortunately, her significant other was just incarcerated. #3 Infertility:  She and her partner have been having unprotected intercourse for over a year.  They have not avoided the fertile periods.  She has not gotten pregnant.  Her partner has fathered children by other women.  Note history of borderline low TSH. 4 Willing to get a flu shot now.  Remains reluctant to get a covid vaccine. 5. Sore left shoulder x 2 weeks.  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       OBJECTIVE:   BP (!) 100/58   Pulse (!) 112   Ht 5\' 3"  (1.6 m)   Wt 108 lb 6.4 oz (49.2 kg)   LMP 03/15/2020   SpO2 97%   BMI 19.20 kg/m   Neck supple Lungs clear Cardiac RRR without m or g Abd benign Left shoulder tender over trapezius and supraspinatus.  Also pain on abduction past 90 degrees   ASSESSMENT/PLAN:   No problem-specific Assessment & Plan notes found for this encounter.     13/06/2019, MD [facility]

## 2020-03-31 NOTE — Assessment & Plan Note (Signed)
Add a controler and refill rescue inhaler.

## 2020-03-31 NOTE — Assessment & Plan Note (Signed)
Because low TSH has been persistent and because she is now complaining of infertility, I will refer to endo to see if this warrants more aggressive treatment.

## 2020-04-28 ENCOUNTER — Other Ambulatory Visit: Payer: Self-pay

## 2020-04-28 ENCOUNTER — Ambulatory Visit (INDEPENDENT_AMBULATORY_CARE_PROVIDER_SITE_OTHER): Payer: BC Managed Care – PPO | Admitting: Endocrinology

## 2020-04-28 ENCOUNTER — Encounter: Payer: Self-pay | Admitting: Endocrinology

## 2020-04-28 VITALS — BP 102/60 | HR 84 | Ht 63.0 in | Wt 110.8 lb

## 2020-04-28 DIAGNOSIS — R946 Abnormal results of thyroid function studies: Secondary | ICD-10-CM

## 2020-04-28 MED ORDER — PROPYLTHIOURACIL 50 MG PO TABS: 25.0000 mg | ORAL_TABLET | Freq: Every day | ORAL | 5 refills | Status: DC

## 2020-04-28 NOTE — Patient Instructions (Signed)
I have sent a prescription to your pharmacy, to slightly slow the thyroid. If ever you have fever while taking this, stop it and call us, even if the reason is obvious, because of the risk of a rare side-effect. It is best to never miss the medication.  However, if you do miss it, next best is to double up the next time. Please come back for a follow-up appointment in 6 weeks.

## 2020-04-28 NOTE — Progress Notes (Signed)
Subjective:    Patient ID: Heather Keller, female    DOB: 1996-03-15, 24 y.o.   MRN: [id number]  HPI Pt is referred by Dr Leveda Anna, for hyperthyroidism.  Pt reports he was dx'ed with hyperthyroidism in 2017.  she has never been on therapy for this.  she has never had XRT to the anterior neck, or thyroid surgery.  she has never had thyroid imaging.  she does not consume kelp or any other non-prescribed thyroid medication.  she has never been on amiodarone.  pt reports tremor and anxiety.   Past Medical History:  Diagnosis Date  . Asthma   . Seasonal allergies     No past surgical history on file.  Social History   Socioeconomic History  . Marital status: Single    Spouse name: Not on file  . Number of children: Not on file  . Years of education: Not on file  . Highest education level: Not on file  Occupational History  . Not on file  Tobacco Use  . Smoking status: Passive Smoke Exposure - Never Smoker  . Smokeless tobacco: Never Used  Substance and Sexual Activity  . Alcohol use: No  . Drug use: No  . Sexual activity: Never  Other Topics Concern  . Not on file  Social History Narrative  . Not on file   Social Determinants of Health   Financial Resource Strain: Not on file  Food Insecurity: Not on file  Transportation Needs: Not on file  Physical Activity: Not on file  Stress: Not on file  Social Connections: Not on file  Intimate Partner Violence: Not on file    Current Outpatient Medications on File Prior to Visit  Medication Sig Dispense Refill  . albuterol (PROAIR HFA) 108 (90 Base) MCG/ACT inhaler INHALE 2 PUFFS BY MOUTH EVERY 6 HOURS AS NEEDED FOR SHORTNESS OF BREATH 18 g 1  . cetirizine (ZYRTEC) 10 MG tablet Take 1 tablet (10 mg total) by mouth daily. 90 tablet 3  . escitalopram (LEXAPRO) 10 MG tablet Take 1 tablet (10 mg total) by mouth daily. 30 tablet 6  . fluticasone (FLOVENT HFA) 110 MCG/ACT inhaler Inhale 2 puffs into the lungs daily. 1 each 12  .  hydrocortisone 2.5 % ointment Apply topically 2 (two) times daily. 30 g 0  . Prenatal Vit-Fe Fumarate-FA (PRENATAL VITAMIN AND MINERAL) 28-0.8 MG TABS Take 1 tablet by mouth daily. 90 tablet 3   No current facility-administered medications on file prior to visit.    Allergies  Allergen Reactions  . Flagyl [Metronidazole] Nausea And Vomiting  . Naproxen Nausea And Vomiting    Family History  Problem Relation Age of Onset  . Thyroid disease Neg Hx     BP 102/60   Pulse 84   Ht 5\' 3"  (1.6 m)   Wt 110 lb 12.8 oz (50.3 kg)   SpO2 99%   BMI 19.63 kg/m     Review of Systems denies weight loss, palpitations, muscle weakness, excessive diaphoresis, and heat intolerance.      Objective:   Physical Exam VS: see vs page GEN: no distress HEAD: head: no deformity eyes: no periorbital swelling, no proptosis external nose and ears are normal NECK: thyroid is slightly and diffusely enlarged.   CHEST WALL: no deformity LUNGS: clear to auscultation CV: reg rate and rhythm, no murmur.  MUSCULOSKELETAL: gait is normal and steady EXTEMITIES: no deformity.  no leg edema NEURO:  readily moves all 4's.  sensation is intact to  touch on all 4's.  No tremor.   SKIN:  Normal texture and temperature.  No rash or suspicious lesion is visible.  Nor diaphoretic.   NODES:  None palpable at the neck.   PSYCH: alert, well-oriented.  Does not appear anxious nor depressed.     Lab Results  Component Value Date   TSH 0.447 (L) 03/30/2020   I have reviewed outside records, and summarized: Pt was noted to have low TSH, and referred here.  She was seen for infertility, and TSH was checked.      Assessment & Plan:  Hyperthyroidism, new to me Infertility: we discussed the fact that tapazole might help.    Patient Instructions  I have sent a prescription to your pharmacy, to slightly slow the thyroid. If ever you have fever while taking this, stop it and call us, even if the reason is obvious,  because of the risk of a rare side-effect. It is best to never miss the medication.  However, if you do miss it, next best is to double up the next time. Please come back for a follow-up appointment in 6 weeks.

## 2020-05-09 ENCOUNTER — Ambulatory Visit: Payer: Self-pay | Admitting: *Deleted

## 2020-05-09 ENCOUNTER — Other Ambulatory Visit: Payer: BC Managed Care – PPO

## 2020-05-09 NOTE — Telephone Encounter (Signed)
I returned pt's call.  I answered her questions and went over the care advice.   She was exposed to covid by her roommate which is also a [employer]in [employer].   Roommate just found out she is covid positive.  Pt is scheduled for a covid test today at 12:00.    Reason for Disposition . [1] CLOSE CONTACT COVID-19 EXPOSURE within last 14 days AND [2] NO symptoms  Answer Assessment - Initial Assessment Questions 1. COVID-19 EXPOSURE: "Please describe how you were exposed to someone with a COVID-19 infection."     Co worker is positive for covid plus we live together.  I have an appt for today to be tested for covid.   Should I stay home.   I'm at work now. 2. PLACE of CONTACT: "Where were you when you were exposed to COVID-19?" (e.g., home, school, medical waiting room; which city?)     work 3. 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 4. DURATION of CONTACT: "How long were you in contact with the COVID-19 patient?" (e.g., a few seconds, passed by person, a few minutes, 15 minutes or longer, live with the patient)     Live together 5. MASK: "Were you wearing a mask?" "Was the other person wearing a mask?" Note: wearing a mask reduces the risk of an otherwise close contact.     No 6. DATE of CONTACT: "When did you have contact with a COVID-19 patient?" (e.g., how many days ago)     At least 2 days ago co worker was tested positive. 7. COMMUNITY SPREAD: "Are there lots of cases of COVID-19 (community spread) where you live?" (See public health department website, if unsure)       Yes 8. SYMPTOMS: "Do you have any symptoms?" (e.g., fever, cough, breathing difficulty, loss of taste or smell)     Just a cough which is not unusual for me. 9. VACCINE: "Have you gotten the COVID-19 vaccine?" If Yes ask: "Which one, how many shots, when did you get it?"     No 10. PREGNANCY OR POSTPARTUM: "Is  there any chance you are pregnant?" "When was your last menstrual period?" "Did you deliver in the last 2 weeks?"       Not asked 11. HIGH RISK: "Do you have any heart or lung problems?" "Do you have a weak immune system?" (e.g., heart failure, COPD, asthma, HIV positive, chemotherapy, renal failure, diabetes mellitus, sickle cell anemia, obesity)       No 12. TRAVEL: "Have you traveled out of the country recently?" If Yes, ask: "When and where?" Also ask about out-of-state travel, since the CDC has identified some high-risk cities for community spread in the [place]. Note: Travel becomes less relevant if there is widespread community transmission where the patient lives.       High transmission in community now.  Protocols used: CORONAVIRUS (COVID-19) EXPOSURE-A-AH

## 2020-12-08 ENCOUNTER — Ambulatory Visit: Payer: BC Managed Care – PPO | Admitting: Family Medicine

## 2020-12-14 ENCOUNTER — Ambulatory Visit: Payer: BC Managed Care – PPO | Admitting: Family Medicine

## 2020-12-21 ENCOUNTER — Ambulatory Visit: Payer: BC Managed Care – PPO | Admitting: Family Medicine

## 2021-04-15 ENCOUNTER — Ambulatory Visit (HOSPITAL_COMMUNITY)
Admission: EM | Admit: 2021-04-15 | Discharge: 2021-04-15 | Disposition: A | Discharge status: Home / Self Care | Payer: BC Managed Care – PPO | Attending: Family Medicine | Admitting: Family Medicine

## 2021-04-15 ENCOUNTER — Encounter (HOSPITAL_COMMUNITY): Payer: Self-pay

## 2021-04-15 ENCOUNTER — Other Ambulatory Visit: Payer: Self-pay

## 2021-04-15 DIAGNOSIS — J069 Acute upper respiratory infection, unspecified: Secondary | ICD-10-CM | POA: Diagnosis not present

## 2021-04-15 DIAGNOSIS — J4521 Mild intermittent asthma with (acute) exacerbation: Secondary | ICD-10-CM | POA: Diagnosis not present

## 2021-04-15 LAB — RESPIRATORY PANEL BY PCR

## 2021-04-15 MED ORDER — DEXAMETHASONE SODIUM PHOSPHATE 10 MG/ML IJ SOLN
10.0000 mg | Freq: Once | INTRAMUSCULAR | Status: AC
  Administered 2021-04-15: 10 mg via INTRAMUSCULAR

## 2021-04-15 MED ORDER — OSELTAMIVIR PHOSPHATE 75 MG PO CAPS: 75.0000 mg | ORAL_CAPSULE | Freq: Two times a day (BID) | ORAL | 0 refills | Status: DC

## 2021-04-15 MED ORDER — DEXAMETHASONE SODIUM PHOSPHATE 10 MG/ML IJ SOLN
INTRAMUSCULAR | Status: AC
  Filled 2021-04-15: qty 1

## 2021-04-15 MED ORDER — PROMETHAZINE-DM 6.25-15 MG/5ML PO SYRP: 5.0000 mL | ORAL_SOLUTION | Freq: Four times a day (QID) | ORAL | 0 refills | Status: DC | PRN

## 2021-04-15 NOTE — ED Triage Notes (Signed)
Pt presents with SOB, body aches and head congestion and cough x 3 days.

## 2021-04-15 NOTE — ED Provider Notes (Signed)
[facility]    CSN: [id number] Arrival date & time: 04/15/21  1508      History   Chief Complaint Chief Complaint  Patient presents with   Shortness of Breath   Headache   Generalized Body Aches    HPI Heather Keller is a 25 y.o. female.   Patient presenting today with 1 day history of body aches, congestion, sinus pressure, chest tightness, shortness of breath, wheezing.  She denies chest pain, significant shortness of breath, abdominal pain, nausea vomiting diarrhea, known fevers.  So far taking TheraFlu and albuterol inhaler for asthma with mild short-term relief.  Multiple sick contacts recently.   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 No pertinent surgical history.  OB History   No obstetric history on file.      Home Medications    Prior to Admission medications   Medication Sig Start Date End Date Taking? Authorizing Provider  oseltamivir (TAMIFLU) 75 MG capsule Take 1 capsule (75 mg total) by mouth every 12 (twelve) hours. 04/15/21  Yes Particia Nearing, PA-C  promethazine-dextromethorphan (PROMETHAZINE-DM) 6.25-15 MG/5ML syrup Take 5 mLs by mouth 4 (four) times daily as needed. 04/15/21  Yes Particia Nearing, PA-C  albuterol [facility] HFA) 108 (90 Base) MCG/ACT inhaler INHALE 2 PUFFS BY MOUTH EVERY 6 HOURS AS NEEDED FOR SHORTNESS OF BREATH 03/30/20   Moses Manners, MD  cetirizine (ZYRTEC) 10 MG tablet Take 1 tablet (10 mg total) by mouth  daily. 01/29/18   Moses Manners, MD  escitalopram (LEXAPRO) 10 MG tablet Take 1 tablet (10 mg total) by mouth daily. 03/30/20   Moses Manners, MD  fluticasone (FLOVENT HFA) 110 MCG/ACT inhaler Inhale 2 puffs into the lungs daily. 03/30/20   Moses Manners, MD  hydrocortisone 2.5 % ointment Apply topically 2 (two) times daily. 12/30/18   Arlyce Harman, MD  Prenatal Vit-Fe Fumarate-FA (PRENATAL VITAMIN AND MINERAL) 28-0.8 MG TABS Take 1 tablet by mouth daily. 10/22/18   Caro Laroche, DO  propylthiouracil (PTU) 50 MG tablet Take 0.5 tablets (25 mg total) by mouth daily. 04/28/20   Romero Belling, MD    Family History Family History  Problem Relation Age of Onset   Thyroid disease Neg Hx     Social History Social History   Tobacco Use   Smoking status: Passive Smoke Exposure - Never Smoker   Smokeless tobacco: Never  Substance Use Topics   Alcohol use: No   Drug use: No     Allergies   Flagyl [metronidazole] and Naproxen   Review of Systems Review of Systems Per HPI  Physical Exam Triage Vital Signs ED Triage Vitals  Enc Vitals Group     BP 04/15/21 1649 (!) 106/55     Pulse Rate 04/15/21 1649 74     Resp 04/15/21 1649 18     Temp 04/15/21 1649 98.1 F (36.7 C)     Temp Source 04/15/21 1649 Oral     SpO2 --  Weight --      Height --      Head Circumference --      Peak Flow --      Pain Score 04/15/21 1647 5     Pain Loc --      Pain Edu? --      Excl. in GC? --    No data found.  Updated Vital Signs BP (!) 106/55   Pulse 74   Temp 98.1 F (36.7 C) (Oral)   Resp 18   LMP 04/11/2021 (Exact Date)   Visual Acuity Right Eye Distance:   Left Eye Distance:   Bilateral Distance:    Right Eye Near:   Left Eye Near:    Bilateral Near:     Physical Exam Vitals and nursing note reviewed.  Constitutional:      Appearance: Normal appearance.  HENT:     Head: Atraumatic.     Right Ear: Tympanic membrane and external ear normal.     Left  Ear: Tympanic membrane and external ear normal.     Nose: Rhinorrhea present.     Mouth/Throat:     Mouth: Mucous membranes are moist.     Pharynx: Posterior oropharyngeal erythema present.  Eyes:     Extraocular Movements: Extraocular movements intact.     Conjunctiva/sclera: Conjunctivae normal.  Cardiovascular:     Rate and Rhythm: Normal rate and regular rhythm.     Heart sounds: Normal heart sounds.  Pulmonary:     Effort: Pulmonary effort is normal.     Breath sounds: Normal breath sounds. No wheezing or rales.  Musculoskeletal:        General: Normal range of motion.     Cervical back: Normal range of motion and neck supple.  Skin:    General: Skin is warm and dry.  Neurological:     Mental Status: She is alert and oriented to person, place, and time.  Psychiatric:        Mood and Affect: Mood normal.        Thought Content: Thought content normal.     UC Treatments / Results  Labs (all labs ordered are listed, but only abnormal results are displayed) Labs Reviewed  RESPIRATORY PANEL BY PCR    EKG   Radiology No results found.  Procedures Procedures (including critical care time)  Medications Ordered in UC Medications  dexamethasone (DECADRON) injection 10 mg (10 mg Intramuscular Given 04/15/21 1738)    Initial Impression / Assessment and Plan / UC Course  I have reviewed the triage vital signs and the nursing notes.  Pertinent labs & imaging results that were available during my care of the patient were reviewed by me and considered in my medical decision making (see chart for details).     Vitals and exam overall reassuring indicative of a viral upper respiratory infection.  Respiratory panel pending, will treat asthma exacerbation with IM Decadron, Phenergan DM in addition to albuterol as needed.  Start Tamiflu proactively in case influenza given community exposures.  Return for acutely worsening symptoms.  Work note given.  Final Clinical  Impressions(s) / UC Diagnoses   Final diagnoses:  Viral URI with cough  Mild intermittent asthma with acute exacerbation   Discharge Instructions   None    ED Prescriptions     Medication Sig Dispense Auth. Provider   promethazine-dextromethorphan (PROMETHAZINE-DM) 6.25-15 MG/5ML syrup Take 5 mLs by mouth 4 (four) times daily as needed. 100 mL Particia Nearing, [place]  oseltamivir (TAMIFLU) 75 MG capsule Take 1 capsule (75 mg total) by mouth every 12 (twelve) hours. 10 capsule Particia Nearing, [place]      PDMP not reviewed this encounter.   Particia Nearing, [place] 04/15/21 1740

## 2021-09-08 ENCOUNTER — Encounter (HOSPITAL_COMMUNITY): Payer: Self-pay | Admitting: *Deleted

## 2021-09-08 ENCOUNTER — Ambulatory Visit (HOSPITAL_COMMUNITY)
Admission: EM | Admit: 2021-09-08 | Discharge: 2021-09-08 | Disposition: A | Discharge status: Home / Self Care | Payer: BC Managed Care – PPO | Attending: Internal Medicine | Admitting: Internal Medicine

## 2021-09-08 ENCOUNTER — Other Ambulatory Visit: Payer: Self-pay

## 2021-09-08 DIAGNOSIS — J45909 Unspecified asthma, uncomplicated: Secondary | ICD-10-CM | POA: Insufficient documentation

## 2021-09-08 DIAGNOSIS — Z20822 Contact with and (suspected) exposure to covid-19: Secondary | ICD-10-CM | POA: Insufficient documentation

## 2021-09-08 DIAGNOSIS — R519 Headache, unspecified: Secondary | ICD-10-CM | POA: Insufficient documentation

## 2021-09-08 DIAGNOSIS — Z28310 Unvaccinated for covid-19: Secondary | ICD-10-CM | POA: Insufficient documentation

## 2021-09-08 DIAGNOSIS — R112 Nausea with vomiting, unspecified: Secondary | ICD-10-CM | POA: Insufficient documentation

## 2021-09-08 MED ORDER — ONDANSETRON 4 MG PO TBDP
ORAL_TABLET | ORAL | Status: AC
  Filled 2021-09-08: qty 1

## 2021-09-08 MED ORDER — GUAIFENESIN ER 1200 MG PO TB12: 1200.0000 mg | ORAL_TABLET | Freq: Two times a day (BID) | ORAL | 0 refills | Status: DC

## 2021-09-08 MED ORDER — KETOROLAC TROMETHAMINE 30 MG/ML IJ SOLN
INTRAMUSCULAR | Status: AC
  Filled 2021-09-08: qty 1

## 2021-09-08 MED ORDER — ONDANSETRON HCL 4 MG PO TABS: 4.0000 mg | ORAL_TABLET | Freq: Four times a day (QID) | ORAL | 0 refills | Status: DC

## 2021-09-08 MED ORDER — ONDANSETRON 4 MG PO TBDP
4.0000 mg | ORAL_TABLET | Freq: Once | ORAL | Status: AC
  Administered 2021-09-08: 4 mg via ORAL

## 2021-09-08 MED ORDER — KETOROLAC TROMETHAMINE 30 MG/ML IJ SOLN
30.0000 mg | Freq: Once | INTRAMUSCULAR | Status: AC
  Administered 2021-09-08: 30 mg via INTRAMUSCULAR

## 2021-09-08 NOTE — ED Triage Notes (Signed)
Pt reports chills,HA,and vomiting that started today. ?

## 2021-09-08 NOTE — ED Provider Notes (Signed)
?[facility] ? ? ? ?CSN: [id number] ?Arrival date & time: 09/08/21  1918 ? ? ?  ? ?History   ?Chief Complaint ?Chief Complaint  ?Patient presents with  ? Emesis  ? Chills  ? Generalized Body Aches  ? ? ?HPI ?Heather Keller is a 26 y.o. female.  ? ?Patient presents to urgent care for evaluation of headache, body aches, chills, nasal congestion, and vomiting that all started this morning when she woke up. She has vomited twice today, the last time being at 6pm today. Reports she gets sick frequently and that she has asthma and allergic rhinitis. Denies cough at this time. Currently rates her headache at an 8 on a scale of 0-10 and it is localized to the frontal aspect of her head. Describes headache as an achy pain that is worse with the light. Took ibuprofen this morning at around 11am this morning with minimal relief of headache/body aches. Denies sick contacts. She is not vaccinated for COVID-19, but she was vaccinated for flu last year. She has been able to keep water and "body armor" down since she threw up at 6pm. Denies any other aggravating or relieving factors.  ? ? ?Emesis ? ?Past Medical History:  ?Diagnosis Date  ? Asthma   ? Seasonal allergies   ? ? ?Patient Active Problem List  ? Diagnosis Date Noted  ? Abnormal thyroid function test 03/31/2020  ? Infertility, female 03/30/2020  ? Preventative health care 01/29/2018  ? Keratosis pilaris 03/01/2014  ? Ear pit 06/04/2013  ? Irritable bowel syndrome 09/24/2012  ? Abdominal pain 01/26/2011  ? 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 RHINITIS, ALLERGIC 07/11/2006  ? Mild persistent asthma 07/11/2006  ? Eczema 07/11/2006  ? ? ?History reviewed. No pertinent surgical history. ? ?OB History   ?No obstetric history on file. ?  ? ? ? ?Home Medications   ? ?Prior to Admission medications   ?Medication Sig Start Date End Date Taking? Authorizing Provider  ?Guaifenesin 1200 MG TB12 Take 1 tablet (1,200 mg total) by mouth in the  morning and at bedtime. 09/08/21  Yes Carlisle Beers, FNP  ?ondansetron (ZOFRAN) 4 MG tablet Take 1 tablet (4 mg total) by mouth every 6 (six) hours. 09/08/21  Yes Carlisle Beers, FNP  ?albuterol (PROAIR HFA) 108 (90 Base) MCG/ACT inhaler INHALE 2 PUFFS BY MOUTH EVERY 6 HOURS AS NEEDED FOR SHORTNESS OF BREATH 03/30/20   Moses Manners, MD  ?cetirizine (ZYRTEC) 10 MG tablet Take 1 tablet (10 mg total) by mouth daily. 01/29/18   Moses Manners, MD  ?escitalopram (LEXAPRO) 10 MG tablet Take 1 tablet (10 mg total) by mouth daily. 03/30/20   Moses Manners, MD  ?fluticasone (FLOVENT HFA) 110 MCG/ACT inhaler Inhale 2 puffs into the lungs daily. 03/30/20   Moses Manners, MD  ?hydrocortisone 2.5 % ointment Apply topically 2 (two) times daily. 12/30/18   Arlyce Harman, MD  ?oseltamivir (TAMIFLU) 75 MG capsule Take 1 capsule (75 mg total) by mouth every 12 (twelve) hours. 04/15/21   Particia Nearing, PA-C  ?Prenatal Vit-Fe Fumarate-FA (PRENATAL VITAMIN AND MINERAL) 28-0.8 MG TABS Take 1 tablet by mouth daily. 10/22/18   Caro Laroche, DO  ?promethazine-dextromethorphan (PROMETHAZINE-DM) 6.25-15 MG/5ML syrup Take 5 mLs by mouth 4 (four) times daily as needed. 04/15/21   Particia Nearing, PA-C  ?propylthiouracil (PTU) 50 MG tablet Take 0.5 tablets (25 mg total) by mouth daily. 04/28/20   Romero Belling, MD  ? ? ?  Family History ?Family History  ?Problem Relation Age of Onset  ? Thyroid disease Neg Hx   ? ? ?Social History ?Social History  ? ?Tobacco Use  ? Smoking status: Never  ?  Passive exposure: Yes  ? Smokeless tobacco: Never  ?Substance Use Topics  ? Alcohol use: No  ? Drug use: No  ? ? ? ?Allergies   ?Flagyl [metronidazole] and Naproxen ? ? ?Review of Systems ?Review of Systems  ?Gastrointestinal:  Positive for vomiting.  ?Per HPI ? ?Physical Exam ?Triage Vital Signs ?ED Triage Vitals  ?Enc Vitals Group  ?   BP 09/08/21 1934 103/68  ?   Pulse Rate 09/08/21 1934 96  ?   Resp 09/08/21  1934 18  ?   Temp 09/08/21 1934 98.3 ?F (36.8 ?C)  ?   Temp src --   ?   SpO2 09/08/21 1934 97 %  ?   Weight --   ?   Height --   ?   Head Circumference --   ?   Peak Flow --   ?   Pain Score 09/08/21 1930 8  ?   Pain Loc --   ?   Pain Edu? --   ?   Excl. in GC? --   ? ?No data found. ? ?Updated Vital Signs ?BP 103/68   Pulse 96   Temp 98.3 ?F (36.8 ?C)   Resp 18   LMP 09/06/2021   SpO2 97%  ? ?Visual Acuity ?Right Eye Distance:   ?Left Eye Distance:   ?Bilateral Distance:   ? ?Right Eye Near:   ?Left Eye Near:    ?Bilateral Near:    ? ?Physical Exam ?Vitals and nursing note reviewed.  ?Constitutional:   ?   General: She is not in acute distress. ?   Appearance: Normal appearance. She is well-developed. She is ill-appearing.  ?   Comments: Very pleasant patient appears fatigued on exam table in no acute distress.  ?HENT:  ?   Head: Normocephalic and atraumatic.  ?   Right Ear: Hearing, tympanic membrane, ear canal and external ear normal.  ?   Left Ear: Hearing, tympanic membrane, ear canal and external ear normal.  ?   Nose: Congestion and rhinorrhea present. No septal deviation, signs of injury or nasal tenderness.  ?   Right Turbinates: Swollen.  ?   Left Turbinates: Swollen.  ?   Right Sinus: Frontal sinus tenderness present. No maxillary sinus tenderness.  ?   Left Sinus: Frontal sinus tenderness present. No maxillary sinus tenderness.  ?   Mouth/Throat:  ?   Mouth: Mucous membranes are moist.  ?   Comments: Erythema and post nasal drip to posterior oropharynx. ?Eyes:  ?   General: Vision grossly intact. Gaze aligned appropriately. No allergic shiner or visual field deficit. ?   Extraocular Movements: Extraocular movements intact.  ?   Conjunctiva/sclera: Conjunctivae normal.  ?Cardiovascular:  ?   Rate and Rhythm: Normal rate and regular rhythm.  ?   Heart sounds: Normal heart sounds. No murmur heard. ?  No friction rub. No gallop.  ?Pulmonary:  ?   Effort: Pulmonary effort is normal. No respiratory  distress.  ?   Breath sounds: Normal breath sounds. No wheezing, rhonchi or rales.  ?Chest:  ?   Chest wall: No tenderness.  ?Abdominal:  ?   General: Abdomen is flat. Bowel sounds are normal.  ?   Palpations: Abdomen is soft.  ?   Tenderness: There is no  abdominal tenderness. There is no right CVA tenderness or left CVA tenderness.  ?Musculoskeletal:     ?   General: No swelling or tenderness.  ?   Cervical back: Normal range of motion and neck supple.  ?Skin: ?   General: Skin is warm and dry.  ?   Capillary Refill: Capillary refill takes less than 2 seconds.  ?   Findings: No rash.  ?Neurological:  ?   General: No focal deficit present.  ?   Mental Status: She is alert and oriented to person, place, and time. Mental status is at baseline.  ?   Cranial Nerves: Cranial nerves 2-12 are intact.  ?   Sensory: Sensation is intact.  ?   Motor: Motor function is intact.  ?   Coordination: Coordination is intact.  ?   Gait: Gait is intact.  ?Psychiatric:     ?   Mood and Affect: Mood normal.     ?   Behavior: Behavior normal.     ?   Thought Content: Thought content normal.     ?   Judgment: Judgment normal.  ? ? ? ?UC Treatments / Results  ?Labs ?(all labs ordered are listed, but only abnormal results are displayed) ?Labs Reviewed  ?SARS CORONAVIRUS 2 (TAT 6-24 HRS)  ? ? ?EKG ? ? ?Radiology ?No results found. ? ?Procedures ?Procedures (including critical care time) ? ?Medications Ordered in UC ?Medications  ?ondansetron (ZOFRAN-ODT) disintegrating tablet 4 mg (4 mg Oral Given 09/08/21 2007)  ?ketorolac (TORADOL) 30 MG/ML injection 30 mg (30 mg Intramuscular Given 09/08/21 2008)  ? ? ?Initial Impression / Assessment and Plan / UC Course  ?I have reviewed the triage vital signs and the nursing notes. ? ?Pertinent labs & imaging results that were available during my care of the patient were reviewed by me and considered in my medical decision making (see chart for details). ? ?Patient's symptoms are consistent with an upper  respiratory viral infection.  Doubt bacterial cause for symptoms at this time due to short history of symptoms and stable physical exam findings in clinic today. COVID-19 test results pending at this time. Influenza A

## 2021-09-08 NOTE — Discharge Instructions (Signed)
You were seen at urgent care today for your nausea, vomiting, cough, headache, and fatigue.  Your symptoms are likely caused by a virus and should resolve within the next 3 to 5 days with symptomatic treatment.  I have tested you for COVID-19 and these results will come back in the next 24 to 48 hours.  If your results are positive or if they change our treatment plan, you will receive a phone call from our callback nurse regarding these results.  If they are negative, you will not receive a phone call from [place]. ? ?I have prescribed Zofran for you to have at home.  This is the same medication that I gave you evaluated in the clinic.  Your next dose of this will be tomorrow morning since you got some while you were here tonight.  I have also prescribed guaifenesin to help thin your mucus so that you are able to blow it out of your nose and cough it up easier.  This medication only works if you drink plenty of fluid. ? ?Do not take any NSAID medications tonight as you received an injection of this type of medication for your headache.  You may take ibuprofen every 6 hours for any body aches or discomfort that you may have with food. ? ?Please try to drink as much fluid, water in particular, as you can.  This will help flush the virus out of your system and help you feel better quicker.  If you notice any new or worsening symptoms, you may always come back to urgent care for further evaluation.  Otherwise, follow-up with your primary care provider.  If your symptoms are severe, please go to the emergency room. ?

## 2021-09-09 LAB — SARS CORONAVIRUS 2 (TAT 6-24 HRS): SARS Coronavirus 2: NEGATIVE

## 2021-10-17 ENCOUNTER — Encounter: Payer: Self-pay | Admitting: *Deleted

## 2021-11-09 ENCOUNTER — Other Ambulatory Visit (HOSPITAL_COMMUNITY)
Admission: RE | Admit: 2021-11-09 | Discharge: 2021-11-09 | Disposition: A | Discharge status: Home / Self Care | Payer: Medicaid Other | Source: Ambulatory Visit | Attending: Family Medicine | Admitting: Family Medicine

## 2021-11-09 ENCOUNTER — Other Ambulatory Visit: Payer: Self-pay

## 2021-11-09 ENCOUNTER — Encounter: Payer: Self-pay | Admitting: Family Medicine

## 2021-11-09 ENCOUNTER — Other Ambulatory Visit: Payer: Self-pay | Admitting: Family Medicine

## 2021-11-09 ENCOUNTER — Ambulatory Visit (INDEPENDENT_AMBULATORY_CARE_PROVIDER_SITE_OTHER): Payer: Self-pay | Admitting: Family Medicine

## 2021-11-09 VITALS — BP 110/77 | HR 87 | Wt 119.6 lb

## 2021-11-09 DIAGNOSIS — Z113 Encounter for screening for infections with a predominantly sexual mode of transmission: Secondary | ICD-10-CM | POA: Insufficient documentation

## 2021-11-09 DIAGNOSIS — Z124 Encounter for screening for malignant neoplasm of cervix: Secondary | ICD-10-CM

## 2021-11-09 LAB — POCT WET PREP (WET MOUNT)
Clue Cells Wet Prep Whiff POC: POSITIVE
Trichomonas Wet Prep HPF POC: ABSENT

## 2021-11-09 MED ORDER — METRONIDAZOLE 0.75 % VA GEL: 1.0000 | Freq: Every day | VAGINAL | 0 refills | Status: AC

## 2021-11-09 NOTE — Progress Notes (Signed)
    SUBJECTIVE:   CHIEF COMPLAINT / HPI:   Vaginal Discharge: Patient is a 26 y.o. female presenting for STI testing since her partner let her know he tested positive for Herpes. She does endorse some vaginal discharge of a clear consistency. She states she has a history of BV. Denies vaginal pain or lesions. Denies urinary symptoms. She is interested in screening for sexually transmitted infections today. Not currently on birth control, stating she tried the shot, and pill and did not like how it made her feel.  LMP 6/13   PERTINENT  PMH / PSH: Reviewed   OBJECTIVE:   BP 110/77   Pulse 87   Wt 119 lb 9.6 oz (54.3 kg)   LMP 10/24/2021   SpO2 99%   BMI 21.19 kg/m    General: NAD, pleasant, able to participate in exam Respiratory: Normal effort, no obvious respiratory distress Pelvic: VULVA: normal appearing vulva with no masses, tenderness or lesions, VAGINA: Normal appearing vagina with normal color, no lesions, with scant and clear discharge present, CERVIX: No lesions, scant and clear discharge present,  Chaperone Deseree present for pelvic exam  ASSESSMENT/PLAN:   No problem-specific Assessment & Plan notes found for this encounter.  26 y.o. female presents for STI testing.  Physical exam unremarkable, with minimal clear discharge and without lesions. Due for pap which she agreed to get done today.  Not interested in contraception.  Discussed using condoms.  -f/u wet prep, GC/chlamydia  -f/u pap smear    Cora Collum, DO [facility]

## 2021-11-09 NOTE — Patient Instructions (Signed)
It was great seeing you today!  Today we tested for STIs, and if anything is positive I will call you.  If normal I will send a MyChart message.  Feel free to call with any questions or concerns at any time, at [phone].   Take care,  Dr. Cora Collum [facility]

## 2021-11-16 LAB — CYTOLOGY - PAP
Chlamydia: NEGATIVE
Comment: NEGATIVE
Comment: NEGATIVE
Comment: NORMAL
Neisseria Gonorrhea: NEGATIVE
Trichomonas: NEGATIVE

## 2022-01-12 ENCOUNTER — Telehealth: Payer: Self-pay | Admitting: Family Medicine

## 2022-01-12 DIAGNOSIS — S93402A Sprain of unspecified ligament of left ankle, initial encounter: Secondary | ICD-10-CM

## 2022-01-12 NOTE — Progress Notes (Signed)
Virtual Visit Consent   [name], you are scheduled for a virtual visit with a [facility] provider today. Just as with appointments in the office, your consent must be obtained to participate. Your consent will be active for this visit and any virtual visit you may have with one of our providers in the next 365 days. If you have a MyChart account, a copy of this consent can be sent to you electronically.  As this is a virtual visit, video technology does not allow for your provider to perform a traditional examination. This may limit your provider's ability to fully assess your condition. If your provider identifies any concerns that need to be evaluated in person or the need to arrange testing (such as labs, EKG, etc.), we will make arrangements to do so. Although advances in technology are sophisticated, we cannot ensure that it will always work on either your end or our end. If the connection with a video visit is poor, the visit may have to be switched to a telephone visit. With either a video or telephone visit, we are not always able to ensure that we have a secure connection.  By engaging in this virtual visit, you consent to the provision of healthcare and authorize for your insurance to be billed (if applicable) for the services provided during this visit. Depending on your insurance coverage, you may receive a charge related to this service.  I need to obtain your verbal consent now. Are you willing to proceed with your visit today? Heather Keller has provided verbal consent on 01/12/2022 for a virtual visit (video or telephone). Freddy Finner, NP  Date: 01/12/2022 11:30 AM  Virtual Visit via Video Note   I, Freddy Finner, connected with  Heather Keller  ([id number], [birth year]) on 01/12/22 at 11:30 AM EDT by a video-enabled telemedicine application and verified that I am speaking with the correct person using two identifiers.  Location: Patient: Virtual Visit Location  Patient: Home Provider: Virtual Visit Location Provider: Home Office   I discussed the limitations of evaluation and management by telemedicine and the availability of in person appointments. The patient expressed understanding and agreed to proceed.    History of Present Illness: Heather Keller is a 26 y.o. who identifies as a female who was assigned female at birth, and is being seen today for ankle pain- going down stairs yesterday- last step she twisted left ankle. Was able to weight bare but with discomfort. Pain started to increase last night, some swelling and mild bruising at the ankle bone. She soaked it in warm water and elevated.     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   RHINITIS, ALLERGIC 07/11/2006   Mild persistent asthma 07/11/2006   Eczema 07/11/2006    Allergies:  Allergies  Allergen Reactions   Flagyl [Metronidazole] Nausea And Vomiting   Naproxen Nausea And Vomiting   Medications:  Current Outpatient Medications:    albuterol (PROAIR HFA) 108 (90 Base) MCG/ACT inhaler, INHALE 2 PUFFS BY MOUTH EVERY 6 HOURS AS NEEDED FOR SHORTNESS OF BREATH, Disp: 18 g, Rfl: 1   cetirizine (ZYRTEC) 10 MG tablet, Take 1 tablet (10 mg total)  by mouth daily., Disp: 90 tablet, Rfl: 3   escitalopram (LEXAPRO) 10 MG tablet, Take 1 tablet (10 mg total) by mouth daily., Disp: 30 tablet, Rfl: 6   fluticasone (FLOVENT HFA) 110 MCG/ACT inhaler, Inhale 2 puffs into the lungs daily., Disp: 1 each, Rfl: 12   Guaifenesin 1200 MG TB12, Take 1 tablet (1,200 mg total) by mouth in the morning and at bedtime., Disp: 14 tablet, Rfl: 0   hydrocortisone 2.5 % ointment, Apply  topically 2 (two) times daily., Disp: 30 g, Rfl: 0   ondansetron (ZOFRAN) 4 MG tablet, Take 1 tablet (4 mg total) by mouth every 6 (six) hours., Disp: 12 tablet, Rfl: 0   oseltamivir (TAMIFLU) 75 MG capsule, Take 1 capsule (75 mg total) by mouth every 12 (twelve) hours., Disp: 10 capsule, Rfl: 0   Prenatal Vit-Fe Fumarate-FA (PRENATAL VITAMIN AND MINERAL) 28-0.8 MG TABS, Take 1 tablet by mouth daily., Disp: 90 tablet, Rfl: 3   promethazine-dextromethorphan (PROMETHAZINE-DM) 6.25-15 MG/5ML syrup, Take 5 mLs by mouth 4 (four) times daily as needed., Disp: 100 mL, Rfl: 0   propylthiouracil (PTU) 50 MG tablet, Take 0.5 tablets (25 mg total) by mouth daily., Disp: 30 tablet, Rfl: 5  Observations/Objective: Patient is well-developed, well-nourished in no acute distress.  Resting comfortably  at home.  Head is normocephalic, atraumatic.  No labored breathing.  Speech is clear and coherent with logical content.  Patient is alert and oriented at baseline.  Left ankle swelling and bruising   Assessment and Plan:  1. Mild sprain of left ankle, initial encounter  -rice measures reviews and on avs -work note for 2 days     Follow Up Instructions: I discussed the assessment and treatment plan with the patient. The patient was provided an opportunity to ask questions and all were answered. The patient agreed with the plan and demonstrated an understanding of the instructions.  A copy of instructions were sent to the patient via MyChart unless otherwise noted below.     The patient was advised to call back or seek an in-person evaluation if the symptoms worsen or if the condition fails to improve as anticipated.  Time:  I spent 10 minutes with the patient via telehealth technology discussing the above problems/concerns.    Freddy Finner, NP

## 2022-01-12 NOTE — Patient Instructions (Signed)
Heather Acosta Joo, thank you for joining Freddy Finner, NP for today's virtual visit.  While this provider is not your primary care provider (PCP), if your PCP is located in our provider database this encounter information will be shared with them immediately following your visit.  Consent: (Patient) Heather Keller provided verbal consent for this virtual visit at the beginning of the encounter.  Current Medications:  Current Outpatient Medications:    albuterol (PROAIR HFA) 108 (90 Base) MCG/ACT inhaler, INHALE 2 PUFFS BY MOUTH EVERY 6 HOURS AS NEEDED FOR SHORTNESS OF BREATH, Disp: 18 g, Rfl: 1   cetirizine (ZYRTEC) 10 MG tablet, Take 1 tablet (10 mg total) by mouth daily., Disp: 90 tablet, Rfl: 3   escitalopram (LEXAPRO) 10 MG tablet, Take 1 tablet (10 mg total) by mouth daily., Disp: 30 tablet, Rfl: 6   fluticasone (FLOVENT HFA) 110 MCG/ACT inhaler, Inhale 2 puffs into the lungs daily., Disp: 1 each, Rfl: 12   Guaifenesin 1200 MG TB12, Take 1 tablet (1,200 mg total) by mouth in the morning and at bedtime., Disp: 14 tablet, Rfl: 0   hydrocortisone 2.5 % ointment, Apply topically 2 (two) times daily., Disp: 30 g, Rfl: 0   ondansetron (ZOFRAN) 4 MG tablet, Take 1 tablet (4 mg total) by mouth every 6 (six) hours., Disp: 12 tablet, Rfl: 0   oseltamivir (TAMIFLU) 75 MG capsule, Take 1 capsule (75 mg total) by mouth every 12 (twelve) hours., Disp: 10 capsule, Rfl: 0   Prenatal Vit-Fe Fumarate-FA (PRENATAL VITAMIN AND MINERAL) 28-0.8 MG TABS, Take 1 tablet by mouth daily., Disp: 90 tablet, Rfl: 3   promethazine-dextromethorphan (PROMETHAZINE-DM) 6.25-15 MG/5ML syrup, Take 5 mLs by mouth 4 (four) times daily as needed., Disp: 100 mL, Rfl: 0   propylthiouracil (PTU) 50 MG tablet, Take 0.5 tablets (25 mg total) by mouth daily., Disp: 30 tablet, Rfl: 5   Medications ordered in this encounter:  No orders of the defined types were placed in this encounter.    *If you need refills on other  medications prior to your next appointment, please contact your pharmacy*  Follow-Up: Call back or seek an in-person evaluation if the symptoms worsen or if the condition fails to improve as anticipated.  Other Instructions RICE Therapy for Routine Care of Injuries Many injuries can be cared for with rest, ice, compression, and elevation (RICE therapy). This includes: Resting the injured body part. Putting ice on the injury. Putting pressure (compression) on the injury. Raising the injured part (elevation). Using RICE therapy can help to lessen pain and swelling. Supplies needed: Ice. Plastic bag. Towel. Elastic bandage. Pillow or pillows to raise your injured body part. How to care for your injury with RICE therapy Rest Try to rest the injured part of your body. You can go back to your normal activities when your doctor says it is okay to do them and when you can do them without pain. If you rest the injury too much, it may not heal as well. Some injuries heal better with early movement instead of resting for too long. Ask your doctor if you should do exercises to help your injury get better. Ice  If told, put ice on the injured area. To do this: Put ice in a plastic bag. Place a towel between your skin and the bag. Leave the ice on for 20 minutes, 2-3 times a day. Take off the ice if your skin turns bright red. This is very important. If you cannot feel pain,  heat, or cold, you have a greater risk of damage to the area. Do not put ice on your bare skin. Use ice for as many days as your doctor tells you to use it. Compression Put pressure on the injured area. This can be done with an elastic bandage. If this type of bandage has been put on your injury: Follow instructions on the package the bandage came in about how to use it. Do not wrap the bandage too tightly. Wrap the bandage more loosely if part of your body beyond the bandage is blue, swollen, cold, painful, or loses  feeling. Take off the bandage and put it on again every 3-4 hours or as told by your doctor. See your doctor if the bandage seems to make your problems worse.  Elevation Raise the injured area above the level of your heart while you are sitting or lying down. Follow these instructions at home: If your symptoms get worse or last a long time, make a follow-up appointment with your doctor. You may need to have imaging tests, such as X-rays or an MRI. If you have imaging tests, ask how to get your results when they are ready. Return to your normal activities when your doctor says that it is safe. Keep all follow-up visits. Contact a doctor if: You keep having pain and swelling. Your symptoms get worse. Get help right away if: You have sudden, very bad pain at your injury or lower than your injury. You have redness or more swelling around your injury. You have tingling or numbness at your injury or lower than your injury, and it does not go away when you take off the bandage. Summary Many injuries can be cared for using rest, ice, compression, and elevation (RICE therapy). You can go back to your normal activities when your doctor says it is okay and when you can do them without pain. Put ice on the injured area as told by your doctor. Get help if your symptoms get worse or if you keep having pain and swelling. This information is not intended to replace advice given to you by your health care provider. Make sure you discuss any questions you have with your health care provider. Document Revised: 02/18/2020 Document Reviewed: 02/18/2020 Elsevier Patient Education  2023 Elsevier Inc.    If you have been instructed to have an in-person evaluation today at a local Urgent Care facility, please use the link below. It will take you to a list of all of our available [facility], including address, phone number and hours of operation. Please do not delay care.  [facility]  If you or a family member do not have a primary care provider, use the link below to schedule a visit and establish care. When you choose a [facility] primary care physician or advanced practice provider, you gain a long-term partner in health. Find a Primary Care Provider  Learn more about [facility]'s in-office and virtual care options: [facility] - Get Care Now

## 2022-06-19 ENCOUNTER — Encounter: Payer: Self-pay | Admitting: Family Medicine

## 2022-11-23 ENCOUNTER — Encounter (HOSPITAL_BASED_OUTPATIENT_CLINIC_OR_DEPARTMENT_OTHER): Payer: Self-pay

## 2022-11-23 ENCOUNTER — Emergency Department (HOSPITAL_BASED_OUTPATIENT_CLINIC_OR_DEPARTMENT_OTHER)
Admission: EM | Admit: 2022-11-23 | Discharge: 2022-11-23 | Disposition: A | Discharge status: Home / Self Care | Payer: Medicaid Other | Source: Home / Self Care | Attending: Emergency Medicine | Admitting: Emergency Medicine

## 2022-11-23 ENCOUNTER — Other Ambulatory Visit: Payer: Self-pay

## 2022-11-23 DIAGNOSIS — J4541 Moderate persistent asthma with (acute) exacerbation: Secondary | ICD-10-CM | POA: Insufficient documentation

## 2022-11-23 DIAGNOSIS — R000 Tachycardia, unspecified: Secondary | ICD-10-CM | POA: Diagnosis not present

## 2022-11-23 DIAGNOSIS — R059 Cough, unspecified: Secondary | ICD-10-CM | POA: Diagnosis present

## 2022-11-23 DIAGNOSIS — J4530 Mild persistent asthma, uncomplicated: Secondary | ICD-10-CM

## 2022-11-23 DIAGNOSIS — J4520 Mild intermittent asthma, uncomplicated: Secondary | ICD-10-CM

## 2022-11-23 MED ORDER — PREDNISONE 10 MG PO TABS: 40.0000 mg | ORAL_TABLET | Freq: Every day | ORAL | 0 refills | Status: DC

## 2022-11-23 MED ORDER — IPRATROPIUM-ALBUTEROL 0.5-2.5 (3) MG/3ML IN SOLN
3.0000 mL | Freq: Once | RESPIRATORY_TRACT | Status: AC
  Administered 2022-11-23: 3 mL via RESPIRATORY_TRACT
  Filled 2022-11-23: qty 3

## 2022-11-23 MED ORDER — PREDNISONE 20 MG PO TABS
40.0000 mg | ORAL_TABLET | Freq: Once | ORAL | Status: AC
  Administered 2022-11-23: 40 mg via ORAL
  Filled 2022-11-23: qty 2

## 2022-11-23 MED ORDER — ALBUTEROL SULFATE HFA 108 (90 BASE) MCG/ACT IN AERS
2.0000 | INHALATION_SPRAY | Freq: Once | RESPIRATORY_TRACT | Status: AC
  Administered 2022-11-23: 2 via RESPIRATORY_TRACT
  Filled 2022-11-23: qty 6.7

## 2022-11-23 MED ORDER — FLUTICASONE PROPIONATE HFA 110 MCG/ACT IN AERO: 2.0000 | INHALATION_SPRAY | Freq: Every day | RESPIRATORY_TRACT | 0 refills | Status: AC

## 2022-11-23 MED ORDER — ALBUTEROL SULFATE HFA 108 (90 BASE) MCG/ACT IN AERS: INHALATION_SPRAY | RESPIRATORY_TRACT | 1 refills | Status: DC

## 2022-11-23 NOTE — ED Triage Notes (Signed)
Coughing non-stop this am  Vomiting r/t coughing Has hx of asthma

## 2022-11-23 NOTE — ED Provider Notes (Signed)
[facility] Provider Note   CSN: [id number] Arrival date & time: 11/23/22  0443     History  Chief Complaint  Patient presents with   Cough    Heather Keller is a 27 y.o. female.  The history is provided by the patient.  Cough Heather Keller is a 27 y.o. female who presents to the Emergency Department complaining of cough.  She presents to the emergency department for evaluation of 2 to 3 months of cough.  Over the last 24 hours her cough has significantly worsened and she has associated shortness of breath with posttussive emesis.  She does have a history of asthma but ran out of her albuterol inhaler about 1 to 2 weeks ago.  Previously her cough would respond to albuterol use.  No associated fever, hemoptysis, abdominal pain, leg swelling or pain.  Denies any chance of pregnancy.  Does not take any hormones.  No history of DVT/PE.       Home Medications Prior to Admission medications   Medication Sig Start Date End Date Taking? Authorizing Provider  predniSONE (DELTASONE) 10 MG tablet Take 4 tablets (40 mg total) by mouth daily. 11/23/22  Yes Tilden Fossa, MD  albuterol [facility] HFA) 108 [phone] Base) MCG/ACT inhaler INHALE 2 PUFFS BY MOUTH EVERY 6 HOURS AS NEEDED FOR SHORTNESS OF BREATH 11/23/22   Tilden Fossa, MD  cetirizine (ZYRTEC) 10 MG tablet Take 1 tablet (10 mg total) by mouth daily. 01/29/18   Moses Manners, MD  escitalopram (LEXAPRO) 10 MG tablet Take 1 tablet (10 mg total) by mouth daily. 03/30/20   Moses Manners, MD  fluticasone (FLOVENT HFA) 110 MCG/ACT inhaler Inhale 2 puffs into the lungs daily. 11/23/22   Tilden Fossa, MD  Guaifenesin 1200 MG TB12 Take 1 tablet (1,200 mg total) by mouth in the morning and at bedtime. 09/08/21   Carlisle Beers, FNP  hydrocortisone 2.5 % ointment Apply topically 2 (two) times daily. 12/30/18   Arlyce Harman, MD  ondansetron (ZOFRAN) 4 MG tablet Take 1 tablet (4 mg  total) by mouth every 6 (six) hours. 09/08/21   Carlisle Beers, FNP  oseltamivir (TAMIFLU) 75 MG capsule Take 1 capsule (75 mg total) by mouth every 12 (twelve) hours. 04/15/21   Particia Nearing, PA-C  Prenatal Vit-Fe Fumarate-FA (PRENATAL VITAMIN AND MINERAL) 28-0.8 MG TABS Take 1 tablet by mouth daily. 10/22/18   Caro Laroche, DO  promethazine-dextromethorphan (PROMETHAZINE-DM) 6.25-15 MG/5ML syrup Take 5 mLs by mouth 4 (four) times daily as needed. 04/15/21   Particia Nearing, PA-C  propylthiouracil (PTU) 50 MG tablet Take 0.5 tablets (25 mg total) by mouth daily. 04/28/20   Romero Belling, MD      Allergies    Flagyl [metronidazole] and Naproxen    Review of Systems   Review of Systems  Respiratory:  Positive for cough.   All other systems reviewed and are negative.   Physical Exam Updated Vital Signs BP 110/70 (BP Location: Right Arm)   Pulse 95   Resp (!) 22   Ht 5\' 3"  (1.6 m)   Wt 58.1 kg   LMP 11/05/2022 (Exact Date)   SpO2 97%   BMI 22.67 kg/m  Physical Exam Vitals and nursing note reviewed.  Constitutional:      Appearance: She is well-developed.  HENT:     Head: Normocephalic and atraumatic.  Cardiovascular:     Rate and Rhythm: Regular rhythm. Tachycardia present.  Heart sounds: No murmur heard. Pulmonary:     Effort: Pulmonary effort is normal. No respiratory distress.     Comments: Tachypnea.  Diffuse wheezing. Abdominal:     Palpations: Abdomen is soft.     Tenderness: There is no abdominal tenderness. There is no guarding or rebound.  Musculoskeletal:        General: No swelling or tenderness.  Skin:    General: Skin is warm and dry.  Neurological:     Mental Status: She is alert and oriented to person, place, and time.  Psychiatric:        Behavior: Behavior normal.     ED Results / Procedures / Treatments   Labs (all labs ordered are listed, but only abnormal results are displayed) Labs Reviewed - No data to  display  EKG None  Radiology No results found.  Procedures Procedures    Medications Ordered in ED Medications  albuterol (VENTOLIN HFA) 108 (90 Base) MCG/ACT inhaler 2 puff (has no administration in time range)  ipratropium-albuterol (DUONEB) 0.5-2.5 (3) MG/3ML nebulizer solution 3 mL (3 mLs Nebulization Given 11/23/22 0512)  predniSONE (DELTASONE) tablet 40 mg (40 mg Oral Given 11/23/22 1610)    ED Course/ Medical Decision Making/ A&P                             Medical Decision Making Risk Prescription drug management.   Patient with history of asthma here for evaluation of shortness of breath, cough.  Patient with wheezing, tachypnea on initial ED assessment.  After DuoNeb she had significant improvement in her symptoms.  She did feel improved and repeat lung exam with resolution of wheezing, good air movement bilaterally.  She was treated with a dose of prednisone, will continue prednisone burst for asthma exacerbation.  Will provide MDI that she may use at home as well as refill her Flovent.  Discussed home care for asthma exacerbation as well as return precautions.  Current clinical picture is not consistent with pneumonia, PE, pneumothorax.        Final Clinical Impression(s) / ED Diagnoses Final diagnoses:  Moderate persistent asthma with exacerbation    Rx / DC Orders ED Discharge Orders          Ordered    predniSONE (DELTASONE) 10 MG tablet  Daily        11/23/22 0546    fluticasone (FLOVENT HFA) 110 MCG/ACT inhaler  Daily        11/23/22 0546    albuterol (PROAIR HFA) 108 (90 Base) MCG/ACT inhaler        11/23/22 0546              Tilden Fossa, MD 11/23/22 0559

## 2023-08-06 ENCOUNTER — Encounter (HOSPITAL_COMMUNITY): Payer: Self-pay

## 2023-08-06 ENCOUNTER — Ambulatory Visit (HOSPITAL_COMMUNITY)
Admission: EM | Admit: 2023-08-06 | Discharge: 2023-08-06 | Disposition: A | Discharge status: Home / Self Care | Attending: Family Medicine | Admitting: Family Medicine

## 2023-08-06 DIAGNOSIS — R112 Nausea with vomiting, unspecified: Secondary | ICD-10-CM

## 2023-08-06 DIAGNOSIS — J4530 Mild persistent asthma, uncomplicated: Secondary | ICD-10-CM

## 2023-08-06 DIAGNOSIS — J101 Influenza due to other identified influenza virus with other respiratory manifestations: Secondary | ICD-10-CM | POA: Diagnosis not present

## 2023-08-06 DIAGNOSIS — J4520 Mild intermittent asthma, uncomplicated: Secondary | ICD-10-CM

## 2023-08-06 LAB — POCT INFLUENZA A/B
Influenza A, POC: POSITIVE — AB
Influenza B, POC: NEGATIVE

## 2023-08-06 MED ORDER — ALBUTEROL SULFATE HFA 108 (90 BASE) MCG/ACT IN AERS: INHALATION_SPRAY | RESPIRATORY_TRACT | 1 refills | Status: DC

## 2023-08-06 MED ORDER — ACETAMINOPHEN 325 MG PO TABS
975.0000 mg | ORAL_TABLET | Freq: Once | ORAL | Status: AC
  Administered 2023-08-06: 975 mg via ORAL

## 2023-08-06 MED ORDER — ONDANSETRON 4 MG PO TBDP
4.0000 mg | ORAL_TABLET | Freq: Once | ORAL | Status: AC
  Administered 2023-08-06: 4 mg via ORAL

## 2023-08-06 MED ORDER — ONDANSETRON 4 MG PO TBDP
ORAL_TABLET | ORAL | Status: AC
  Filled 2023-08-06: qty 1

## 2023-08-06 MED ORDER — ACETAMINOPHEN 325 MG PO TABS
ORAL_TABLET | ORAL | Status: AC
  Filled 2023-08-06: qty 3

## 2023-08-06 MED ORDER — OSELTAMIVIR PHOSPHATE 75 MG PO CAPS: 75.0000 mg | ORAL_CAPSULE | Freq: Two times a day (BID) | ORAL | 0 refills | Status: AC

## 2023-08-06 MED ORDER — ONDANSETRON 4 MG PO TBDP: 4.0000 mg | ORAL_TABLET | Freq: Three times a day (TID) | ORAL | 0 refills | Status: AC | PRN

## 2023-08-06 NOTE — Discharge Instructions (Signed)
 You have the flu. Take Tamiflu every 12 hours for the next 5 days to improve symptoms and stop the virus from replicating in your body. Tylenol as needed for fevers and body aches. Mucinex as needed for nasal congestion. Zofran as needed for nausea/vomiting.  If you develop any new or worsening symptoms or if your symptoms do not start to improve, please return here or follow-up with your primary care provider. If your symptoms are severe, please go to the emergency room.

## 2023-08-06 NOTE — ED Provider Notes (Addendum)
 [facility]    CSN: [id number] Arrival date & time: 08/06/23  1632      History   Chief Complaint Chief Complaint  Patient presents with   Cough    HPI Heather Keller is a 28 y.o. female.   Patient presents to urgent care for evaluation of cough, nasal congestion, fever, chills, sore throat, nausea, vomiting, and bodyaches that started abruptly this morning upon waking.  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  Cough is productive with clear sputum.  She has had multiple episodes of nonbloody/nonbilious emesis today.  Denies diarrhea, abdominal pain, rash, dizziness, shortness of breath, chest pain, heart palpitations, and headache.  History of asthma, she does not currently have access to an albuterol inhaler and would like a refill on this today.  Denies recent antibiotic or steroid use.  Using over-the-counter medications without much relief.    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 No pertinent surgical history.  OB History   No obstetric history on file.      Home Medications    Prior to Admission medications   Medication Sig Start Date End Date Taking? Authorizing Provider  ondansetron (ZOFRAN-ODT) 4 MG disintegrating tablet Take 1 tablet (4 mg total) by mouth every 8 (eight) hours as needed for nausea or vomiting. 08/06/23  Yes Carlisle Beers, FNP  oseltamivir  (TAMIFLU) 75 MG capsule Take 1 capsule (75 mg total) by mouth every 12 (twelve) hours. 08/06/23  Yes Carlisle Beers, FNP  albuterol (PROAIR HFA) 108 (90 Base) MCG/ACT inhaler INHALE 2 PUFFS BY MOUTH EVERY 6 HOURS AS NEEDED FOR SHORTNESS OF BREATH 08/06/23   Carlisle Beers, FNP  cetirizine (ZYRTEC) 10 MG tablet Take 1 tablet (10 mg total) by mouth daily. 01/29/18   Moses Manners, MD  escitalopram (LEXAPRO) 10 MG tablet Take 1 tablet (10 mg total) by mouth daily. Patient not taking: Reported on 08/06/2023 03/30/20   Moses Manners, MD  fluticasone (FLOVENT HFA) 110 MCG/ACT inhaler Inhale 2 puffs into the lungs daily. 11/23/22   Tilden Fossa, MD  hydrocortisone 2.5 % ointment Apply topically 2 (two) times daily. 12/30/18   Arlyce Harman, MD  promethazine-dextromethorphan (PROMETHAZINE-DM) 6.25-15 MG/5ML syrup Take 5 mLs by mouth 4 (four) times daily as needed. Patient not taking: Reported on 08/06/2023 04/15/21   Particia Nearing, PA-C    Family History Family History  Problem Relation Age of Onset   Thyroid disease Neg Hx     Social History Social History   Tobacco Use   Smoking status: Former    Types: Cigarettes    Passive exposure: Yes   Smokeless tobacco: Never  Vaping Use   Vaping status: Never Used  Substance Use Topics   Alcohol use: No    Comment: occasionally   Drug use: No     Allergies   Flagyl [metronidazole] and Naproxen  Review of Systems Review of Systems  Respiratory:  Positive for cough.   Per HPI   Physical Exam Triage Vital Signs ED Triage Vitals [08/06/23 1735]  Encounter Vitals Group     BP 107/70     Systolic BP Percentile      Diastolic BP Percentile      Pulse Rate (!) 108     Resp 16     Temp (!) 103 F (39.4 C)     Temp Source Oral     SpO2 94 %     Weight      Height 5\' 3"  (1.6 m)     Head Circumference      Peak Flow      Pain Score 9     Pain Loc      Pain Education      Exclude from Growth Chart     No data found.  Updated Vital Signs BP 107/70 (BP Location: Right Arm)   Pulse (!) 108   Temp (!) 103 F (39.4 C) (Oral)   Resp 16   Ht 5\' 3"  (1.6 m)   LMP 07/27/2023 (Exact Date)   SpO2 94%   BMI 22.67 kg/m   Visual Acuity Right Eye Distance:   Left Eye Distance:   Bilateral Distance:    Right Eye Near:   Left Eye Near:    Bilateral Near:     Physical Exam Vitals and nursing note reviewed.  Constitutional:      Appearance: She is ill-appearing. She is not toxic-appearing.  HENT:     Head: Normocephalic and atraumatic.     Right Ear: Hearing, tympanic membrane, ear canal and external ear normal.     Left Ear: Hearing, tympanic membrane, ear canal and external ear normal.     Nose: Congestion present.     Mouth/Throat:     Lips: Pink.     Mouth: Mucous membranes are moist. No injury or oral lesions.     Dentition: Normal dentition.     Tongue: No lesions.     Pharynx: Oropharynx is clear. Uvula midline. No pharyngeal swelling, oropharyngeal exudate, posterior oropharyngeal erythema, uvula swelling or postnasal drip.     Tonsils: No tonsillar exudate.  Eyes:     General: Lids are normal. Vision grossly intact. Gaze aligned appropriately.     Extraocular Movements: Extraocular movements intact.     Conjunctiva/sclera: Conjunctivae normal.  Neck:     Trachea: Trachea and phonation normal.  Cardiovascular:     Rate and Rhythm: Normal rate and regular rhythm.     Heart sounds: Normal heart sounds, S1 normal and S2 normal.  Pulmonary:     Effort: Pulmonary effort is normal. No respiratory distress.     Breath sounds: Normal breath sounds and air entry. No wheezing, rhonchi or rales.  Chest:     Chest wall: No tenderness.  Musculoskeletal:     Cervical back: Neck supple.  Lymphadenopathy:     Cervical: No cervical adenopathy.  Skin:    General: Skin is warm and dry.     Capillary Refill: Capillary refill takes less than 2 seconds.     Findings: No rash.   Neurological:     General: No focal deficit present.     Mental Status: She is alert and oriented to person, place, and time. Mental status is at baseline.     Cranial Nerves: No dysarthria or facial asymmetry.  Psychiatric:        Mood and  Affect: Mood normal.        Speech: Speech normal.        Behavior: Behavior normal.        Thought Content: Thought content normal.        Judgment: Judgment normal.      UC Treatments / Results  Labs (all labs ordered are listed, but only abnormal results are displayed) Labs Reviewed  POCT INFLUENZA A/B - Abnormal; Notable for the following components:      Result Value   Influenza A, POC Positive (*)    All other components within normal limits    EKG   Radiology No results found.  Procedures Procedures (including critical care time)  Medications Ordered in UC Medications  acetaminophen (TYLENOL) tablet 975 mg (975 mg Oral Given 08/06/23 1752)  ondansetron (ZOFRAN-ODT) disintegrating tablet 4 mg (4 mg Oral Given 08/06/23 1752)    Initial Impression / Assessment and Plan / UC Course  I have reviewed the triage vital signs and the nursing notes.  Pertinent labs & imaging results that were available during my care of the patient were reviewed by me and considered in my medical decision making (see chart for details).   1.  Influenza A, nausea and vomiting Flu A point of care testing positive. Lungs clear, vitals hemodynamically stable, therefore deferred imaging. Interventions in clinic: deferred given low suspicion for acute cardiopulmonary abnormality.  Offered antiviral given timing of illness, Tamiflu sent to pharmacy.  Recommend supportive care for further symptomatic relief as outlined in AVS.  Modes of transmission, quarantine recommendations, and hand hygiene discussed. Tylenol given in clinic for fever of 103. Zofran ODT 4 mg given for nausea with some relief prior to leaving clinic.  Counseled patient on potential  for adverse effects with medications prescribed/recommended today, strict ER and return-to-clinic precautions discussed, patient verbalized understanding.    Final Clinical Impressions(s) / UC Diagnoses   Final diagnoses:  Influenza A  Nausea and vomiting, unspecified vomiting type     Discharge Instructions      You have the flu. Take Tamiflu every 12 hours for the next 5 days to improve symptoms and stop the virus from replicating in your body. Tylenol as needed for fevers and body aches. Mucinex as needed for nasal congestion. Zofran as needed for nausea/vomiting.  If you develop any new or worsening symptoms or if your symptoms do not start to improve, please return here or follow-up with your primary care provider. If your symptoms are severe, please go to the emergency room.    ED Prescriptions     Medication Sig Dispense Auth. Provider   ondansetron (ZOFRAN-ODT) 4 MG disintegrating tablet Take 1 tablet (4 mg total) by mouth every 8 (eight) hours as needed for nausea or vomiting. 20 tablet Carlisle Beers, FNP   oseltamivir (TAMIFLU) 75 MG capsule Take 1 capsule (75 mg total) by mouth every 12 (twelve) hours. 10 capsule Carlisle Beers, FNP   albuterol (PROAIR HFA) 108 (90 Base) MCG/ACT inhaler INHALE 2 PUFFS BY MOUTH EVERY 6 HOURS AS NEEDED FOR SHORTNESS OF BREATH 18 g Carlisle Beers, FNP      PDMP not reviewed this encounter.   Carlisle Beers, FNP 08/06/23 1825    Carlisle Beers, FNP 08/06/23 [phone]

## 2023-08-06 NOTE — ED Triage Notes (Signed)
 Patient here today with c/o cough, body aches, headache, fever, chills, nausea, and vomiting that started this morning. Patient has taken Theraflu this morning with no relief. Patient works at a daycare.

## 2023-09-11 ENCOUNTER — Other Ambulatory Visit: Payer: Self-pay

## 2023-09-11 DIAGNOSIS — J4530 Mild persistent asthma, uncomplicated: Secondary | ICD-10-CM

## 2023-09-11 DIAGNOSIS — J4520 Mild intermittent asthma, uncomplicated: Secondary | ICD-10-CM

## 2023-09-11 MED ORDER — PROMETHAZINE-DM 6.25-15 MG/5ML PO SYRP: 5.0000 mL | ORAL_SOLUTION | Freq: Four times a day (QID) | ORAL | 0 refills | Status: AC | PRN

## 2023-09-11 MED ORDER — ALBUTEROL SULFATE HFA 108 (90 BASE) MCG/ACT IN AERS: INHALATION_SPRAY | RESPIRATORY_TRACT | 1 refills | Status: AC

## 2023-09-11 NOTE — Telephone Encounter (Signed)
 Patient calls nurse line requesting a refill on Albuterol  and Promethazine  DM.   She reports she has an apt scheduled with PCP on 5/8.  Advised will forward to PCP.

## 2023-09-19 ENCOUNTER — Ambulatory Visit: Admitting: Family Medicine
